# Patient Record
Sex: Male | Born: 1955 | Race: White | Hispanic: No | Smoking: Current every day smoker
Health system: Southern US, Community
[De-identification: ages and names within clinical notes are randomized; demographics above are authoritative.]

## PROBLEM LIST (undated history)

## (undated) DIAGNOSIS — F419 Anxiety disorder, unspecified: Secondary | ICD-10-CM

## (undated) DIAGNOSIS — J449 Chronic obstructive pulmonary disease, unspecified: Secondary | ICD-10-CM

## (undated) HISTORY — PX: APPENDECTOMY: SHX54

---

## 2015-09-07 ENCOUNTER — Encounter (HOSPITAL_COMMUNITY): Payer: Self-pay | Admitting: Emergency Medicine

## 2015-09-07 ENCOUNTER — Emergency Department (HOSPITAL_COMMUNITY): Payer: PRIVATE HEALTH INSURANCE

## 2015-09-07 ENCOUNTER — Emergency Department (HOSPITAL_COMMUNITY)
Admission: EM | Admit: 2015-09-07 | Discharge: 2015-09-07 | Disposition: A | Discharge status: Home / Self Care | Payer: PRIVATE HEALTH INSURANCE | Attending: Emergency Medicine | Admitting: Emergency Medicine

## 2015-09-07 DIAGNOSIS — F1020 Alcohol dependence, uncomplicated: Secondary | ICD-10-CM | POA: Diagnosis not present

## 2015-09-07 DIAGNOSIS — F17210 Nicotine dependence, cigarettes, uncomplicated: Secondary | ICD-10-CM | POA: Diagnosis not present

## 2015-09-07 DIAGNOSIS — Z8659 Personal history of other mental and behavioral disorders: Secondary | ICD-10-CM | POA: Insufficient documentation

## 2015-09-07 DIAGNOSIS — Z880 Allergy status to penicillin: Secondary | ICD-10-CM | POA: Insufficient documentation

## 2015-09-07 DIAGNOSIS — R0602 Shortness of breath: Secondary | ICD-10-CM | POA: Diagnosis present

## 2015-09-07 DIAGNOSIS — Z5900 Homelessness unspecified: Secondary | ICD-10-CM

## 2015-09-07 DIAGNOSIS — J441 Chronic obstructive pulmonary disease with (acute) exacerbation: Secondary | ICD-10-CM | POA: Insufficient documentation

## 2015-09-07 HISTORY — DX: Chronic obstructive pulmonary disease, unspecified: J44.9

## 2015-09-07 HISTORY — DX: Anxiety disorder, unspecified: F41.9

## 2015-09-07 LAB — CBC WITH DIFFERENTIAL/PLATELET
Basophils Absolute: 0 10*3/uL (ref 0.0–0.1)
Basophils Relative: 0 %
Eosinophils Absolute: 0.1 10*3/uL (ref 0.0–0.7)
Eosinophils Relative: 0 %
HEMATOCRIT: 43.7 % (ref 39.0–52.0)
HEMOGLOBIN: 15.6 g/dL (ref 13.0–17.0)
LYMPHS ABS: 1.6 10*3/uL (ref 0.7–4.0)
Lymphocytes Relative: 12 %
MCH: 34.7 pg — AB (ref 26.0–34.0)
MCHC: 35.7 g/dL (ref 30.0–36.0)
MCV: 97.1 fL (ref 78.0–100.0)
MONOS PCT: 10 %
Monocytes Absolute: 1.3 10*3/uL — ABNORMAL HIGH (ref 0.1–1.0)
NEUTROS ABS: 10.5 10*3/uL — AB (ref 1.7–7.7)
NEUTROS PCT: 78 %
Platelets: 245 10*3/uL (ref 150–400)
RBC: 4.5 MIL/uL (ref 4.22–5.81)
RDW: 13.7 % (ref 11.5–15.5)
WBC: 13.4 10*3/uL — ABNORMAL HIGH (ref 4.0–10.5)

## 2015-09-07 LAB — BASIC METABOLIC PANEL
ANION GAP: 10 (ref 5–15)
BUN: 5 mg/dL — ABNORMAL LOW (ref 6–20)
CHLORIDE: 88 mmol/L — AB (ref 101–111)
CO2: 25 mmol/L (ref 22–32)
Calcium: 8.5 mg/dL — ABNORMAL LOW (ref 8.9–10.3)
Creatinine, Ser: 0.69 mg/dL (ref 0.61–1.24)
GFR calc non Af Amer: >60 mL/min (ref >60)
Glucose, Bld: 93 mg/dL (ref 65–99)
POTASSIUM: 4.5 mmol/L (ref 3.5–5.1)
Sodium: 123 mmol/L — ABNORMAL LOW (ref 135–145)

## 2015-09-07 MED ORDER — PREDNISONE 20 MG PO TABS: 20.0000 mg | ORAL_TABLET | Freq: Two times a day (BID) | ORAL | Status: DC

## 2015-09-07 MED ORDER — PREDNISONE 20 MG PO TABS
60.0000 mg | ORAL_TABLET | Freq: Once | ORAL | Status: AC
  Administered 2015-09-07: 60 mg via ORAL
  Filled 2015-09-07: qty 3

## 2015-09-07 MED ORDER — ALBUTEROL SULFATE HFA 108 (90 BASE) MCG/ACT IN AERS
2.0000 | INHALATION_SPRAY | RESPIRATORY_TRACT | Status: DC | PRN
  Administered 2015-09-07: 2 via RESPIRATORY_TRACT
  Filled 2015-09-07: qty 6.7

## 2015-09-07 MED ORDER — CHLORDIAZEPOXIDE HCL 25 MG PO CAPS
25.0000 mg | ORAL_CAPSULE | Freq: Once | ORAL | Status: AC
  Administered 2015-09-07: 25 mg via ORAL
  Filled 2015-09-07: qty 1

## 2015-09-07 MED ORDER — ALBUTEROL (5 MG/ML) CONTINUOUS INHALATION SOLN
10.0000 mg/h | INHALATION_SOLUTION | Freq: Once | RESPIRATORY_TRACT | Status: AC
  Administered 2015-09-07: 10 mg/h via RESPIRATORY_TRACT
  Filled 2015-09-07: qty 20

## 2015-09-07 MED ORDER — CHLORDIAZEPOXIDE HCL 25 MG PO CAPS: ORAL_CAPSULE | ORAL | Status: DC

## 2015-09-07 MED ORDER — AEROCHAMBER Z-STAT PLUS/MEDIUM MISC
1.0000 | Freq: Once | Status: DC
  Filled 2015-09-07: qty 1

## 2015-09-07 NOTE — ED Provider Notes (Signed)
CSN: [id number]     Arrival date & time 09/07/15  4098 History   First MD Initiated Contact with Patient 09/07/15 [phone]     Chief Complaint  Patient presents with  . Shortness of Breath     (Consider location/radiation/quality/duration/timing/severity/associated sxs/prior Treatment) HPI   Mark Sellers is a 60 y.o. male who is homeless, presents for evaluation of cough, productive of green sputum, shortness of breath, and weakness. Onset of symptoms 3 or 4 days ago. He admits smoking. He has felt cold but not taken his temperature. He has some chest discomfort, with coughing. He denies nausea, vomiting, back pain, headache or dizziness. He's not had any recent medical care, or recent medical illnesses. There are no other known modifying factors.   Past Medical History  Diagnosis Date  . COPD (chronic obstructive pulmonary disease) (HCC)   . Anxiety    History reviewed. No pertinent past surgical history. No family history on file. Social History  Substance Use Topics  . Smoking status: Current Every Day Smoker -- 0.50 packs/day    Types: Cigarettes  . Smokeless tobacco: None  . Alcohol Use: Yes    Review of Systems  All other systems reviewed and are negative.     Allergies  Codeine and Penicillins  Home Medications   Prior to Admission medications   Not on File   BP 130/96 mmHg  Pulse 70  Temp(Src) 98.4 F (36.9 C) (Oral)  Resp 16  SpO2 92% Physical Exam  Constitutional: He is oriented to person, place, and time. He appears well-developed.  Appears older than stated age. Frail, malnourished appearance.  HENT:  Head: Normocephalic and atraumatic.  Right Ear: External ear normal.  Left Ear: External ear normal.  Eyes: Conjunctivae and EOM are normal. Pupils are equal, round, and reactive to light.  Neck: Normal range of motion and phonation normal. Neck supple.  Cardiovascular: Normal rate, regular rhythm and normal heart sounds.   Pulmonary/Chest: Effort  normal. No respiratory distress. He has no rales. He exhibits no tenderness and no bony tenderness.  Decreased bilaterally, with scattered rhonchi and wheezes.  Abdominal: Soft. There is no tenderness.  Musculoskeletal: Normal range of motion. He exhibits no edema or tenderness.  Neurological: He is alert and oriented to person, place, and time. No cranial nerve deficit or sensory deficit. He exhibits normal muscle tone. Coordination normal.  Skin: Skin is warm, dry and intact.  Psychiatric: He has a normal mood and affect. His behavior is normal. Judgment and thought content normal.  Nursing note and vitals reviewed.   ED Course  Procedures (including critical care time) Medications  albuterol (PROVENTIL HFA;VENTOLIN HFA) 108 (90 Base) MCG/ACT inhaler 2 puff (2 puffs Inhalation Given 09/07/15 1207)  aerochamber Z-Stat Plus/medium 1 each (not administered)  albuterol (PROVENTIL,VENTOLIN) solution continuous neb (10 mg/hr Nebulization Given 09/07/15 0916)  predniSONE (DELTASONE) tablet 60 mg (60 mg Oral Given 09/07/15 0854)  chlordiazePOXIDE (LIBRIUM) capsule 25 mg (25 mg Oral Given 09/07/15 1208)    Patient Vitals for the past 24 hrs:  BP Temp Temp src Pulse Resp SpO2  09/07/15 1206 130/96 mmHg - - 70 - -  09/07/15 1130 133/79 mmHg 98.4 F (36.9 C) Oral 71 16 92 %  09/07/15 0959 161/87 mmHg - - 79 22 95 %  09/07/15 0918 - - - - - 92 %  09/07/15 0843 134/77 mmHg - - 81 25 92 %  09/07/15 0841 134/77 mmHg 97.8 F (36.6 C) Oral 65 20 93 %  09/07/15 0839 - - - - - 98 %    12:07 PM Reevaluation with update and discussion. After initial assessment and treatment, an updated evaluation reveals he states his breathing is better. Lungs have somewhat improved air movement, without audible wheezing at this time. He now states, "I had a panic attack earlier." He also states, "I'm an alcoholic and have had a drink in a while, and shaking." Hands with mild tremor, he remains lucid and cooperative. He  requests alcohol detoxification. I informed him that his symptoms would be best treated as an outpatient and alcohol treatment center, which is available in the community. Librium and albuterol inhaler ordered. Mark Sellers L   He was evaluated by management in ED, who gave him resources for follow-up care and assistance.    Labs Review Labs Reviewed  BASIC METABOLIC PANEL - Abnormal; Notable for the following:    Sodium 123 (*)    Chloride 88 (*)    BUN 5 (*)    Calcium 8.5 (*)    All other components within normal limits  CBC WITH DIFFERENTIAL/PLATELET - Abnormal; Notable for the following:    WBC 13.4 (*)    MCH 34.7 (*)    Neutro Abs 10.5 (*)    Monocytes Absolute 1.3 (*)    All other components within normal limits    Imaging Review Dg Chest 2 View  09/07/2015  CLINICAL DATA:  Shortness of breath with cough and congestion for 2 days. History of COPD. Chronic tobacco use EXAM: CHEST  2 VIEW COMPARISON:  None. FINDINGS: Lungs are hyperexpanded with prominence of the retrosternal clear space, findings that are consistent with the stated history of COPD. There is slight scarring in the right upper lobe. There is no edema or consolidation. The heart size and pulmonary vascularity are normal. No adenopathy. No bone lesions. IMPRESSION: Evidence of a degree of underlying COPD. Mild scarring right upper lobe. No edema or consolidation. Electronically Signed   By: Bretta Bang III M.D.   On: 09/07/2015 09:19   I have personally reviewed and evaluated these images and lab results as part of my medical decision-making.   EKG Interpretation   Date/Time:  Friday September 07 2015 08:40:08 EST Ventricular Rate:  58 PR Interval:  147 QRS Duration: 92 QT Interval:  451 QTC Calculation: 443 R Axis:   85 Text Interpretation:  Sinus rhythm Anteroseptal infarct, age indeterminate  No old tracing to compare Confirmed by [facility]  MD, Kegan Mckeithan [phone]) on  09/07/2015 8:49:00 AM      MDM    Final diagnoses:  COPD exacerbation (HCC)  Alcoholism (HCC)  Homelessness    Exacerbation, without concerning for pneumonia, sepsis or metabolic instability. Doubt ACS. Alcoholism, with mild tremor, but no evidence for DTs, or concern for worsening neurologic status.  Nursing Notes Reviewed/ Care Coordinated Applicable Imaging Reviewed Interpretation of Laboratory Data incorporated into ED treatment  The patient appears reasonably screened and/or stabilized for discharge and I doubt any other medical condition or other [facility] requiring further screening, evaluation, or treatment in the ED at this time prior to discharge.  Plan: Home Medications- Librium taper; Home Treatments- stop EtOH; return here if the recommended treatment, does not improve the symptoms; Recommended follow up- PCP and Treatment Center of choice asap    Mancel Bale, MD 09/07/15 1420

## 2015-09-07 NOTE — ED Notes (Signed)
Pt complaint of anxiety attack. Hyperventilation noted upon entering pt room; able to use therapeutic discussion to calm pt; pt baseline at present time and then VS assessment. Pt states more frequent attacks with bouts of SOB; pt denies PTA medications for anxiety. [place] aware.

## 2015-09-07 NOTE — ED Notes (Signed)
Per EMS pt complaint of new onset SOB 0200 today; hx of COPD.

## 2015-09-07 NOTE — Progress Notes (Signed)
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 [place]" Pt noted with large bags of clothes in room and military clothes Cm discussed with him that Benjamine Sprague was in [place] not [place]  CM offered [facility] as the uninsured International aid/development worker for [employer] and [place] veteran's administration hospitals and clinics  CM discussed nebulizer use Cm discussed albuterol use for emergencies Pt states his girlfriend who he would not given a name or contact # would also assist him Pt called girlfriend using the ED phone provided by ED RN  All written resources placed in a pt belonging bag for pt CM encourage him to also seek services from [employer] senior resources agency   Also CM discussed and provided written information for uninsured accepting pcps, discussed the importance of pcp vs EDP services for f/u care, www.needymeds.org, www.goodrx.com, discounted pharmacies and other [employer] such as [employer] , [employer], affordable care act, financial assistance, uninsured dental services, [place] med assist, DSS and  health department  Reviewed resources for [employer] uninsured accepting pcps like Jovita Kussmaul, family medicine at [employer], [facility], [facility], local urgent care centers, [facility], [facility] family practice, general medical clinics, family services of the [place], [facility] urgent care plus others, medication resources, [facility] out patient pharmacies and housing Pt voiced understanding and appreciation of resources provided

## 2015-09-07 NOTE — Discharge Instructions (Signed)
Avoid all forms of alcohol. Use the inhaler 2 puffs every 4 hours for trouble breathing. Follow-up at one of the listed alcohol treatment centers below.    Chronic Obstructive Pulmonary Disease Exacerbation Chronic obstructive pulmonary disease (COPD) is a common lung condition in which airflow from the lungs is limited. COPD is a general term that can be used to describe many different lung problems that limit airflow, including chronic bronchitis and emphysema. COPD exacerbations are episodes when breathing symptoms become much worse and require extra treatment. Without treatment, COPD exacerbations can be life threatening, and frequent COPD exacerbations can cause further damage to your lungs. CAUSES  Respiratory infections.  Exposure to smoke.  Exposure to air pollution, chemical fumes, or dust. Sometimes there is no apparent cause or trigger. RISK FACTORS  Smoking cigarettes.  Older age.  Frequent prior COPD exacerbations. SIGNS AND SYMPTOMS  Increased coughing.  Increased thick spit (sputum) production.  Increased wheezing.  Increased shortness of breath.  Rapid breathing.  Chest tightness. DIAGNOSIS Your medical history, a physical exam, and tests will help your health care provider make a diagnosis. Tests may include:  A chest X-ray.  Basic lab tests.  Sputum testing.  An arterial blood gas test. TREATMENT Depending on the severity of your COPD exacerbation, you may need to be admitted to a hospital for treatment. Some of the treatments commonly used to treat COPD exacerbations are:   Antibiotic medicines.  Bronchodilators. These are drugs that expand the air passages. They may be given with an inhaler or nebulizer. Spacer devices may be needed to help improve drug delivery.  Corticosteroid medicines.  Supplemental oxygen therapy.  Airway clearing techniques, such as noninvasive ventilation (NIV) and positive expiratory pressure (PEP). These provide  respiratory support through a mask or other noninvasive device. HOME CARE INSTRUCTIONS  Do not smoke. Quitting smoking is very important to prevent COPD from getting worse and exacerbations from happening as often.  Avoid exposure to all substances that irritate the airway, especially to tobacco smoke.  If you were prescribed an antibiotic medicine, finish it all even if you start to feel better.  Take all medicines as directed by your health care provider.It is important to use correct technique with inhaled medicines.  Drink enough fluids to keep your urine clear or pale yellow (unless you have a medical condition that requires fluid restriction).  Use a cool mist vaporizer. This makes it easier to clear your chest when you cough.  If you have a home nebulizer and oxygen, continue to use them as directed.  Maintain all necessary vaccinations to prevent infections.  Exercise regularly.  Eat a healthy diet.  Keep all follow-up appointments as directed by your health care provider. SEEK IMMEDIATE MEDICAL CARE IF:  You have worsening shortness of breath.  You have trouble talking.  You have severe chest pain.  You have blood in your sputum.  You have a fever.  You have weakness, vomit repeatedly, or faint.  You feel confused.  You continue to get worse. MAKE SURE YOU:  Understand these instructions.  Will watch your condition.  Will get help right away if you are not doing well or get worse.   This information is not intended to replace advice given to you by your health care provider. Make sure you discuss any questions you have with your health care provider.   Document Released: 05/25/2007 Document Revised: 08/18/2014 Document Reviewed: 04/01/2013 Elsevier Interactive Patient Education 2016 [employer].   [facility] in  the [employer]  Intensive Outpatient Programs: [facility]      [address] [place],  [place] [phone] Both a day and evening program       [facility] Outpatient     [address]        [place], [place] [address] [phone]         ADS: [facility] [address] [place] [phone]  [facility] Mental Health ACCESS LINE: [phone] or [phone] [address] [place], [place] [address] [employer]  Mobile Crisis Teams:                                        [facility]         Mobile Crisis Care Unit [phone]             [facility] [address] [place]in[place] [phone]                                         Intervention[facility] [address] [place] [address] [place] [phone]  Self-Help/Support Groups: Mental Health Assoc. of [employer] of support groups [phone] (call for more info)  Narcotics Anonymous (NA) Caring Services [address] [place] - 2 meetings at this location  Residential Treatment Programs:  [facility]      [address]        [place]       [phone]         [facility] [address] [place] [address] [place], [place]  [phone]  [facility]  [address] [place], [place] [address] [phone] Admissions: 8am-3pm M-F  [facility]     [address]        [place], [place] [address]       [phone]         [facility] [address] [place], [place] [phone]  [facility] [address] [place], [place] [phone]  [facility] - Intensive Outpatient      [address]     [place], [place]       [phone]         [facility] ([facility]     [address] [place], [place] [phone] or [phone]  [facility] ([facility])  [address] [place],  [place] [phone]  [facility] [address]                                               [address] [place] [phone]  [facility] Resources: [phone]               General Therapy                                                Angie Fava, PhD        [phone]  [address] [place], [place] [address]         [phone]   Insurance  [facility]   [address] [place], [place] [address] [phone]  [facility] [address] [place], [place] [address] [phone] Insurance/Medicaid/sponsorship through [facility]                                              [address]                                        [place], [place] [address]    Therapy/tele-psych/case         [phone]          [facility] [address] [place], [place]  [address]  Adolescent/group home/case management [phone]                                           Creola Corn PhD       General therapy       Insurance   [phone]         Dr. Lolly Mustache Insurance [phone] M-F  [facility] Medicaid, sponsorship [phone]

## 2015-09-07 NOTE — Progress Notes (Signed)
ED RN consulted ED CM about pt needing uninsured resources

## 2015-09-08 ENCOUNTER — Emergency Department (HOSPITAL_COMMUNITY)
Admission: EM | Admit: 2015-09-08 | Discharge: 2015-09-08 | Disposition: A | Discharge status: Home / Self Care | Payer: PRIVATE HEALTH INSURANCE | Attending: Emergency Medicine | Admitting: Emergency Medicine

## 2015-09-08 ENCOUNTER — Encounter: Payer: Self-pay | Admitting: Emergency Medicine

## 2015-09-08 ENCOUNTER — Emergency Department: Payer: PRIVATE HEALTH INSURANCE

## 2015-09-08 ENCOUNTER — Emergency Department
Admission: EM | Admit: 2015-09-08 | Discharge: 2015-09-08 | Disposition: A | Discharge status: Home / Self Care | Payer: PRIVATE HEALTH INSURANCE | Attending: Emergency Medicine | Admitting: Emergency Medicine

## 2015-09-08 ENCOUNTER — Encounter (HOSPITAL_COMMUNITY): Payer: Self-pay

## 2015-09-08 DIAGNOSIS — R079 Chest pain, unspecified: Secondary | ICD-10-CM | POA: Diagnosis not present

## 2015-09-08 DIAGNOSIS — Z7952 Long term (current) use of systemic steroids: Secondary | ICD-10-CM | POA: Diagnosis not present

## 2015-09-08 DIAGNOSIS — F17210 Nicotine dependence, cigarettes, uncomplicated: Secondary | ICD-10-CM | POA: Insufficient documentation

## 2015-09-08 DIAGNOSIS — Z5900 Homelessness unspecified: Secondary | ICD-10-CM

## 2015-09-08 DIAGNOSIS — J441 Chronic obstructive pulmonary disease with (acute) exacerbation: Secondary | ICD-10-CM | POA: Diagnosis not present

## 2015-09-08 DIAGNOSIS — R200 Anesthesia of skin: Secondary | ICD-10-CM | POA: Insufficient documentation

## 2015-09-08 DIAGNOSIS — Z880 Allergy status to penicillin: Secondary | ICD-10-CM | POA: Diagnosis not present

## 2015-09-08 DIAGNOSIS — Z79899 Other long term (current) drug therapy: Secondary | ICD-10-CM | POA: Diagnosis not present

## 2015-09-08 DIAGNOSIS — F419 Anxiety disorder, unspecified: Secondary | ICD-10-CM | POA: Insufficient documentation

## 2015-09-08 LAB — CBC
HEMATOCRIT: 44.7 % (ref 40.0–52.0)
Hemoglobin: 15.3 g/dL (ref 13.0–18.0)
MCH: 34.3 pg — ABNORMAL HIGH (ref 26.0–34.0)
MCHC: 34.1 g/dL (ref 32.0–36.0)
MCV: 100.5 fL — ABNORMAL HIGH (ref 80.0–100.0)
PLATELETS: 245 10*3/uL (ref 150–440)
RBC: 4.45 MIL/uL (ref 4.40–5.90)
RDW: 14.8 % — AB (ref 11.5–14.5)
WBC: 13.5 10*3/uL — AB (ref 3.8–10.6)

## 2015-09-08 LAB — COMPREHENSIVE METABOLIC PANEL
ALBUMIN: 4.2 g/dL (ref 3.5–5.0)
ALT: 30 U/L (ref 17–63)
AST: 42 U/L — AB (ref 15–41)
Alkaline Phosphatase: 54 U/L (ref 38–126)
Anion gap: 14 (ref 5–15)
BUN: 10 mg/dL (ref 6–20)
CHLORIDE: 92 mmol/L — AB (ref 101–111)
CO2: 24 mmol/L (ref 22–32)
Calcium: 9.3 mg/dL (ref 8.9–10.3)
Creatinine, Ser: 0.77 mg/dL (ref 0.61–1.24)
GFR calc Af Amer: >60 mL/min (ref >60)
GFR calc non Af Amer: >60 mL/min (ref >60)
GLUCOSE: 119 mg/dL — AB (ref 65–99)
POTASSIUM: 4.5 mmol/L (ref 3.5–5.1)
SODIUM: 130 mmol/L — AB (ref 135–145)
Total Bilirubin: 1.2 mg/dL (ref 0.3–1.2)
Total Protein: 8.1 g/dL (ref 6.5–8.1)

## 2015-09-08 LAB — TROPONIN I: Troponin I: <0.03 ng/mL (ref <0.031)

## 2015-09-08 MED ORDER — ALBUTEROL SULFATE (2.5 MG/3ML) 0.083% IN NEBU
5.0000 mg | INHALATION_SOLUTION | Freq: Once | RESPIRATORY_TRACT | Status: AC
  Administered 2015-09-08: 5 mg via RESPIRATORY_TRACT

## 2015-09-08 MED ORDER — ALBUTEROL SULFATE (2.5 MG/3ML) 0.083% IN NEBU
INHALATION_SOLUTION | RESPIRATORY_TRACT | Status: AC
  Administered 2015-09-08: 5 mg via RESPIRATORY_TRACT
  Filled 2015-09-08: qty 3

## 2015-09-08 MED ORDER — IPRATROPIUM-ALBUTEROL 0.5-2.5 (3) MG/3ML IN SOLN
3.0000 mL | Freq: Once | RESPIRATORY_TRACT | Status: AC
  Administered 2015-09-08: 3 mL via RESPIRATORY_TRACT
  Filled 2015-09-08: qty 3

## 2015-09-08 MED ORDER — LORAZEPAM 1 MG PO TABS
1.0000 mg | ORAL_TABLET | Freq: Once | ORAL | Status: AC
  Administered 2015-09-08: 1 mg via ORAL
  Filled 2015-09-08: qty 1

## 2015-09-08 MED ORDER — DEXAMETHASONE SODIUM PHOSPHATE 10 MG/ML IJ SOLN
10.0000 mg | Freq: Once | INTRAMUSCULAR | Status: AC
  Administered 2015-09-08: 10 mg via INTRAMUSCULAR
  Filled 2015-09-08: qty 1

## 2015-09-08 MED ORDER — DOXYCYCLINE HYCLATE 100 MG PO CAPS: 100.0000 mg | ORAL_CAPSULE | Freq: Two times a day (BID) | ORAL | Status: DC

## 2015-09-08 NOTE — ED Provider Notes (Signed)
[facility] Emergency Department Provider Note  ____________________________________________   I have reviewed the triage vital signs and the nursing notes.   HISTORY  Chief Complaint Chest tightness   HPI Mark Sellers is a 60 y.o. male who presents today with primary complaint of chest tightness. He reports this started earlier in the day and has been intermittent. He told the nurse that he is having left arm numbness but he denies this to me. He reports his breathing is at baseline. Patient apparently was seen at [facility] ED last night for similar complaints.     Past Medical History  Diagnosis Date  . COPD (chronic obstructive pulmonary disease) (HCC)   . Anxiety     There are no active problems to display for this patient.   No past surgical history on file.  Current Outpatient Rx  Name  Route  Sig  Dispense  Refill  . chlordiazePOXIDE (LIBRIUM) 25 MG capsule       PO TID x 1D, then 25-50mg  PO BID X 1D, then 25-50mg  PO QD X 1D   10 capsule   0     For symptoms of alcohol withdrawal   . doxycycline (VIBRAMYCIN) 100 MG capsule   Oral   Take 1 capsule (100 mg total) by mouth 2 (two) times daily.   20 capsule   0   . predniSONE (DELTASONE) 20 MG tablet   Oral   Take 1 tablet (20 mg total) by mouth 2 (two) times daily.   10 tablet   0     Allergies Codeine and Penicillins  No family history on file.  Social History Social History  Substance Use Topics  . Smoking status: Current Every Day Smoker -- 0.50 packs/day    Types: Cigarettes  . Smokeless tobacco: None  . Alcohol Use: Yes    Review of Systems  Constitutional: Negative for fever. Eyes: Negative for visual changes. ENT: Negative for sore throat Cardiovascular: Positive for chest tightness Respiratory: Denies shortness of breath Gastrointestinal: Negative for abdominal pain Genitourinary: Negative for dysuria. Musculoskeletal: Negative for back pain. Skin:  Negative for rash. Neurological: Negative for headaches or focal weakness Psychiatric: No anxiety    ____________________________________________   PHYSICAL EXAM:  VITAL SIGNS: ED Triage Vitals  Enc Vitals Group     BP 09/08/15 1839 124/80 mmHg     Pulse Rate 09/08/15 1839 80     Resp 09/08/15 1839 18     Temp 09/08/15 1839 97.7 F (36.5 C)     Temp Source 09/08/15 1839 Oral     SpO2 09/08/15 1839 93 %     Weight 09/08/15 1839 145 lb (65.772 kg)     Height 09/08/15 1839 6' (1.829 m)     Head Cir --      Peak Flow --      Pain Score 09/08/15 1840 9     Pain Loc --      Pain Edu? --      Excl. in GC? --      Constitutional: Alert and oriented. Well appearing and in no distress. Eyes: Conjunctivae are normal.  ENT   Head: Normocephalic and atraumatic.   Mouth/Throat: Mucous membranes are moist. Cardiovascular: Normal rate, regular rhythm. Normal and symmetric distal pulses are present in all extremities. No murmurs, rubs, or gallops. Respiratory: Normal respiratory effort without tachypnea nor retractions. Scattered wheezes Gastrointestinal: Soft and non-tender in all quadrants. No distention. There is no CVA tenderness. Genitourinary: deferred Musculoskeletal: Nontender with  normal range of motion in all extremities. No lower extremity tenderness nor edema. Neurologic:  Normal speech and language. No gross focal neurologic deficits are appreciated. Skin:  Skin is warm, dry and intact. No rash noted. Psychiatric: Mood and affect are normal. Patient exhibits appropriate insight and judgment.  ____________________________________________    LABS (pertinent positives/negatives)  Labs Reviewed  CBC - Abnormal; Notable for the following:    WBC 13.5 (*)    MCV 100.5 (*)    MCH 34.3 (*)    RDW 14.8 (*)    All other components within normal limits  COMPREHENSIVE METABOLIC PANEL - Abnormal; Notable for the following:    Sodium 130 (*)    Chloride 92 (*)     Glucose, Bld 119 (*)    AST 42 (*)    All other components within normal limits  TROPONIN I    ____________________________________________   EKG  ED ECG REPORT I, Jene Every, the attending physician, personally viewed and interpreted this ECG.  Date: 09/08/2015 EKG Time: 6:43 PM Rate: 80 Rhythm: normal sinus rhythm QRS Axis: normal Intervals: normal ST/T Wave abnormalities: normal Conduction Disturbances: none Narrative Interpretation: unremarkable   ____________________________________________    RADIOLOGY I have personally reviewed any xrays that were ordered on this patient: Chest x-ray unremarkable  ____________________________________________   PROCEDURES  Procedure(s) performed: none  Critical Care performed:none  ____________________________________________   INITIAL IMPRESSION / ASSESSMENT AND PLAN / ED COURSE  Pertinent labs & imaging results that were available during my care of the patient were reviewed by me and considered in my medical decision making (see chart for details).  Patient well-appearing and in no distress. His EKG is unremarkable and reassuring. His lab work is essentially unchanged from yesterday. He does have some scattered wheezes which is likely the cause of this chest tightness. We will give a DuoNeb in the emergency department. He denies numbness to me although he noted this triage nurse. He is neurologically intact and has good strength in all extremities.  ____________________________________________   FINAL CLINICAL IMPRESSION(S) / ED DIAGNOSES  Final diagnoses:  Chest pain, unspecified chest pain type     Jene Every, MD 09/08/15 2227

## 2015-09-08 NOTE — Discharge Instructions (Signed)

## 2015-09-08 NOTE — ED Provider Notes (Signed)
CSN: [id number]     Arrival date & time 09/08/15  0225 History   First MD Initiated Contact with Patient 09/08/15 [phone]     Chief Complaint  Patient presents with  . Shortness of Breath   The history is provided by the patient. No language interpreter was used.   Mark Sellers is a 60 y.o. male with history of COPD and anxiety who presents to the Emergency Department complaining of worsening SOB with onset 3-4 days ago. Associated symptoms include chills, productive cough, and anxiety. Pt denies fever. He was seen in the ED less than 24 hours ago but notes that his symptoms are worse since then and his inhaler has provided insufficient relief. He is homeless and not on home oxygen. Prior to this exacerbation, it had been years since he was last seen in the hospital for COPD.  He last used alcohol 1 week ago but was a daily drinker prior to that. He states he has had tremors with previous times of abstinence.   Past Medical History  Diagnosis Date  . COPD (chronic obstructive pulmonary disease) (HCC)   . Anxiety    History reviewed. No pertinent past surgical history. History reviewed. No pertinent family history. Social History  Substance Use Topics  . Smoking status: Current Every Day Smoker -- 0.50 packs/day    Types: Cigarettes  . Smokeless tobacco: None  . Alcohol Use: Yes    Review of Systems  Constitutional: Positive for chills. Negative for fever.  Respiratory: Positive for cough and shortness of breath.   Cardiovascular: Negative for chest pain.  Psychiatric/Behavioral: The patient is nervous/anxious.   All other systems reviewed and are negative.  Allergies  Codeine and Penicillins  Home Medications   Prior to Admission medications   Medication Sig Start Date End Date Taking? Authorizing Provider  chlordiazePOXIDE (LIBRIUM) 25 MG capsule  PO TID x 1D, then 25-50mg  PO BID X 1D, then 25-50mg  PO QD X 1D 09/07/15  Yes Mancel Bale, MD  predniSONE (DELTASONE) 20 MG  tablet Take 1 tablet (20 mg total) by mouth 2 (two) times daily. 09/07/15  Yes Mancel Bale, MD  doxycycline (VIBRAMYCIN) 100 MG capsule Take 1 capsule (100 mg total) by mouth 2 (two) times daily. 09/08/15   Shon Baton, MD   BP 156/91 mmHg  Pulse 76  Temp(Src) 99.6 F (37.6 C) (Oral)  Resp 22  SpO2 93% Physical Exam  Constitutional: He is oriented to person, place, and time.  Disheveled, unkempt, no acute distress  HENT:  Head: Normocephalic and atraumatic.  Eyes: Pupils are equal, round, and reactive to light.  Cardiovascular: Normal rate, regular rhythm and normal heart sounds.   No murmur heard. Pulmonary/Chest: Effort normal and breath sounds normal. No respiratory distress. He has no wheezes.  Fair movement, scant expiratory wheeze  Abdominal: Soft. Bowel sounds are normal. There is no tenderness. There is no rebound.  Musculoskeletal: He exhibits no edema.  Neurological: He is alert and oriented to person, place, and time.  Skin: Skin is warm and dry.  Psychiatric: He has a normal mood and affect.  Nursing note and vitals reviewed.   ED Course  Procedures (including critical care time) DIAGNOSTIC STUDIES: Oxygen Saturation is 93% on RA, adequate by my interpretation.    COORDINATION OF CARE: 3:36 AM Discussed treatment plan which includes a breathing treatment with pt at bedside and pt agreed to plan.  Labs Review Labs Reviewed - No data to display  Imaging Review Dg Chest  2 View  09/07/2015  CLINICAL DATA:  Shortness of breath with cough and congestion for 2 days. History of COPD. Chronic tobacco use EXAM: CHEST  2 VIEW COMPARISON:  None. FINDINGS: Lungs are hyperexpanded with prominence of the retrosternal clear space, findings that are consistent with the stated history of COPD. There is slight scarring in the right upper lobe. There is no edema or consolidation. The heart size and pulmonary vascularity are normal. No adenopathy. No bone lesions. IMPRESSION:  Evidence of a degree of underlying COPD. Mild scarring right upper lobe. No edema or consolidation. Electronically Signed   By: Bretta Bang III M.D.   On: 09/07/2015 09:19     EKG Interpretation None      MDM   Final diagnoses:  COPD exacerbation (HCC)  Homelessness    Patient presents with persistent shortness of breath and cough. Nontoxic on exam.  Afebrile. O2 sats on room air 93-96%.  No acute distress. Minimal wheezing on initial exam. I have reviewed workup from earlier today. No evidence of pneumonia. Patient was given IM Decadron. DuoNeb 2. On repeat exam, patient with clear breath sounds bilaterally. He was able to ambulate and maintain pulse ox greater than 92%. Patient is currently homeless. He was evaluated by case management yesterday and given resources for shelters. He tells me that "if I can get to [facility] got someone I can stay with."  Patient was encouraged to continue steroids and his inhaler. He will also be discharged with doxycycline given history of COPD.  After history, exam, and medical workup I feel the patient has been appropriately medically screened and is safe for discharge home. Pertinent diagnoses were discussed with the patient. Patient was given return precautions.  I personally performed the services described in this documentation, which was scribed in my presence. The recorded information has been reviewed and is accurate.     Shon Baton, MD 09/08/15 [phone]

## 2015-09-08 NOTE — ED Notes (Signed)
Pt. Waiting for ride in waiting room. 

## 2015-09-08 NOTE — ED Notes (Signed)
Pt reports SOB and left arm numbness that started 3 hours ago while sitting on the side of the road. Pt also reports chest pain.

## 2015-09-08 NOTE — ED Notes (Addendum)
Pt walked to RN desk hyperventalating stating he was having difficulty breathing.

## 2015-09-08 NOTE — ED Notes (Addendum)
Pt states he had a COPD exacerbation when walking outside moments ago. Pt states he has x4 episodes of hyperventalating while waiting in the [facility] ED Lobby. Pt seen here at 1406 on 1/27 for COPD Exacerbation.

## 2015-09-08 NOTE — Discharge Instructions (Signed)
Chronic Obstructive Pulmonary Disease Chronic obstructive pulmonary disease (COPD) is a common lung condition in which airflow from the lungs is limited. COPD is a general term that can be used to describe many different lung problems that limit airflow, including both chronic bronchitis and emphysema. If you have COPD, your lung function will probably never return to normal, but there are measures you can take to improve lung function and make yourself feel better. CAUSES   Smoking (common).  Exposure to secondhand smoke.  Genetic problems.  Chronic inflammatory lung diseases or recurrent infections. SYMPTOMS  Shortness of breath, especially with physical activity.  Deep, persistent (chronic) cough with a large amount of thick mucus.  Wheezing.  Rapid breaths (tachypnea).  Gray or bluish discoloration (cyanosis) of the skin, especially in your fingers, toes, or lips.  Fatigue.  Weight loss.  Frequent infections or episodes when breathing symptoms become much worse (exacerbations).  Chest tightness. DIAGNOSIS Your health care provider will take a medical history and perform a physical examination to diagnose COPD. Additional tests for COPD may include:  Lung (pulmonary) function tests.  Chest X-ray.  CT scan.  Blood tests. TREATMENT  Treatment for COPD may include:  Inhaler and nebulizer medicines. These help manage the symptoms of COPD and make your breathing more comfortable.  Supplemental oxygen. Supplemental oxygen is only helpful if you have a low oxygen level in your blood.  Exercise and physical activity. These are beneficial for nearly all people with COPD.  Lung surgery or transplant.  Nutrition therapy to gain weight, if you are underweight.  Pulmonary rehabilitation. This may involve working with a team of health care providers and specialists, such as respiratory, occupational, and physical therapists. HOME CARE INSTRUCTIONS  Take all medicines  (inhaled or pills) as directed by your health care provider.  Avoid over-the-counter medicines or cough syrups that dry up your airway (such as antihistamines) and slow down the elimination of secretions unless instructed otherwise by your health care provider.  If you are a smoker, the most important thing that you can do is stop smoking. Continuing to smoke will cause further lung damage and breathing trouble. Ask your health care provider for help with quitting smoking. He or she can direct you to community resources or hospitals that provide support.  Avoid exposure to irritants such as smoke, chemicals, and fumes that aggravate your breathing.  Use oxygen therapy and pulmonary rehabilitation if directed by your health care provider. If you require home oxygen therapy, ask your health care provider whether you should purchase a pulse oximeter to measure your oxygen level at home.  Avoid contact with individuals who have a contagious illness.  Avoid extreme temperature and humidity changes.  Eat healthy foods. Eating smaller, more frequent meals and resting before meals may help you maintain your strength.  Stay active, but balance activity with periods of rest. Exercise and physical activity will help you maintain your ability to do things you want to do.  Preventing infection and hospitalization is very important when you have COPD. Make sure to receive all the vaccines your health care provider recommends, especially the pneumococcal and influenza vaccines. Ask your health care provider whether you need a pneumonia vaccine.  Learn and use relaxation techniques to manage stress.  Learn and use controlled breathing techniques as directed by your health care provider. Controlled breathing techniques include:  Pursed lip breathing. Start by breathing in (inhaling) through your nose for 1 second. Then, purse your lips as if you were  going to whistle and breathe out (exhale) through the  pursed lips for 2 seconds.  Diaphragmatic breathing. Start by putting one hand on your abdomen just above your waist. Inhale slowly through your nose. The hand on your abdomen should move out. Then purse your lips and exhale slowly. You should be able to feel the hand on your abdomen moving in as you exhale.  Learn and use controlled coughing to clear mucus from your lungs. Controlled coughing is a series of short, progressive coughs. The steps of controlled coughing are: 1. Lean your head slightly forward. 2. Breathe in deeply using diaphragmatic breathing. 3. Try to hold your breath for 3 seconds. 4. Keep your mouth slightly open while coughing twice. 5. Spit any mucus out into a tissue. 6. Rest and repeat the steps once or twice as needed. SEEK MEDICAL CARE IF:  You are coughing up more mucus than usual.  There is a change in the color or thickness of your mucus.  Your breathing is more labored than usual.  Your breathing is faster than usual. SEEK IMMEDIATE MEDICAL CARE IF:  You have shortness of breath while you are resting.  You have shortness of breath that prevents you from:  Being able to talk.  Performing your usual physical activities.  You have chest pain lasting longer than 5 minutes.  Your skin color is more cyanotic than usual.  You measure low oxygen saturations for longer than 5 minutes with a pulse oximeter. MAKE SURE YOU:  Understand these instructions.  Will watch your condition.  Will get help right away if you are not doing well or get worse.   This information is not intended to replace advice given to you by your health care provider. Make sure you discuss any questions you have with your health care provider.   Document Released: 05/07/2005 Document Revised: 08/18/2014 Document Reviewed: 03/24/2013 Elsevier Interactive Patient Education [employer]2016 Elsevier Inc.   Emergency Department Resource Guide 1) Find a Doctor and Pay Out of Pocket Although  you won't have to find out who is covered by your insurance plan, it is a good idea to ask around and get recommendations. You will then need to call the office and see if the doctor you have chosen will accept you as a new patient and what types of options they offer for patients who are self-pay. Some doctors offer discounts or will set up payment plans for their patients who do not have insurance, but you will need to ask so you aren't surprised when you get to your appointment.  2) Contact Your Local Health Department Not all health departments have doctors that can see patients for sick visits, but many do, so it is worth a call to see if yours does. If you don't know where your local health department is, you can check in your phone book. The CDC also has a tool to help you locate your state's health department, and many state websites also have listings of all of their local health departments.  3) Find a Walk-in Clinic If your illness is not likely to be very severe or complicated, you may want to try a walk in clinic. These are popping up all over the country in pharmacies, drugstores, and shopping centers. They're usually staffed by nurse practitioners or physician assistants that have been trained to treat common illnesses and complaints. They're usually fairly quick and inexpensive. However, if you have serious medical issues or chronic medical problems, these are probably not  your best option.  No Primary Care Doctor: - Call Health Connect at  [phone] - they can help you locate a primary care doctor that  accepts your insurance, provides certain services, etc. - Physician Referral Service- [phone]  Chronic Pain Problems: Organization         Address  Phone   Notes  [facility]  [phone] Patients need to be referred by their primary care doctor.   Medication Assistance: Organization         Address  Phone   Notes  [facility] Medication [facility] [address] [place] [place], [place] [address] [phone] --Must be a resident of [facility] -- Must have NO insurance coverage whatsoever (no Medicaid/ Medicare, etc.) -- The pt. MUST have a primary care doctor that directs their care regularly and follows them in the community   [facility]  [phone]   [employer]  [phone]    Agencies that provide inexpensive medical care: Organization         Address  Phone   Notes  [facility]  [phone]   [facility]    [phone]   [facility] [address] [place], [place] [address] [phone]   [facility] [address] [place] [phone]   Planned Parenthood    [phone]   [facility]    [phone]   [facility]  [address], [place] Phone:  [phone], Fax:  [phone] Hours of Operation:  9 am - 6 pm, M-F.  Also accepts Medicaid/Medicare and self-pay.  [facility]  [address], [place] Phone: [phone], Fax: [phone]. Hours of Operation:  8:30 am - 5:30 pm, M-F.  Also accepts Medicaid and self-pay.  [facility] [place] [address] [place] Phone: [phone]   [facility] [address] Natasha Bence [place], [place] [phone], Ext. [phone] Mondays & Thursdays: 7-9 AM.  First 15 patients are seen on a first come, first serve basis.    Medicaid-accepting [facility] Providers:  Organization         Address  Phone   Notes  [facility] [address], [place] [phone] Also accepts self-pay patients.  [facility] [address] Laurell Josephs [place], [place]  [phone]   [facility] [address], [place] [phone]   [facility] [address] [place] [phone]   Renaye Rakers [address], [place]   [phone] Only accepts [place] Access [place] patients after they have their name applied to their card.   Self-Pay (no insurance) in [facility]:  Organization         Address  Phone   Notes  Sickle Cell Patients, [facility] [address] [place], [place] [phone]   [facility] [address] [place], [place] [phone]   [facility] [place]  [address], [place] [phone]   [facility]/Dr. Osei-Bonsu  [address], [place] or [address], [place] [phone] Phone number for both [place] and [place] locations is the same.  [facility] [address]  Dr, Ginette Otto [phone]   [facility] [address] [place] or [address] [phone]   [facility] [address], [place] [phone], phone; [phone], fax Sees patients 1st and 3rd Saturday of every month.  Must not qualify for public or private insurance (i.e. Medicaid, Medicare, [place] Health Choice, Veterans' Benefits)  Household income should be no more than 200% of the poverty level The clinic cannot treat you if you are pregnant or think you are pregnant  Sexually transmitted diseases are not treated at the clinic.    Dental Care: Organization         Address  Phone  Notes  [facility] [address] [place], [place] [phone] Accepts children up to age 55 who are enrolled in [place] or [place] Health Choice; pregnant women with a Medicaid card; and children who have applied for Medicaid or [place] Health Choice, but were declined, whose parents can pay a reduced fee at time of service.  [facility] of [facility]  [address], [place] [phone] Accepts  children up to age 41 who are enrolled in [place] or [place] Health Choice; pregnant women with a Medicaid card; and children who have applied for Medicaid or [place] Health Choice, but were declined, whose parents can pay a reduced fee at time of service.  [facility]  [address] [place], [place] [phone] Patients are seen by appointment only. Walk-ins are not accepted. [facility] will see patients 41 years of age and older. Monday - Tuesday (8am-5pm) Most Wednesdays (8:30-5pm) $30 per visit, cash only  [facility] Adult Dental Access PROGRAM  [address], [facility] [phone] Patients are seen by appointment only. Walk-ins are not accepted. [facility] will see patients 99 years of age and older. One Wednesday Evening (Monthly: Volunteer Based).  $30 per visit, cash only  [employer]  [phone] for adults; Children under age 6, call Graduate Pediatric Dentistry at [phone]. Children aged 69-14, please call [phone] to request a pediatric application.  Dental services are provided in all areas of dental care including fillings, crowns and bridges, complete and partial dentures, implants, gum treatment, root canals, and extractions. Preventive care is also provided. Treatment is provided to both adults and children. Patients are selected via a lottery and there is often a waiting list.   [facility] [address] [place]  [phone] [email or web address]   [facility] [address] [place], [place] [phone], Ext. [phone] Second and Fourth Thursday of each month, opens at 6:30 AM; Clinic ends at 9 AM.  Patients are seen on a first-come first-served basis, and a limited number are seen during each clinic.   [facility]  [address] Ether Griffins [place], [place] [phone]   Eligibility Requirements You must have lived in [place], [place], or [place] counties for at least the  last three months.   You cannot be eligible for state or federal sponsored [place], including [employer], [place], or [employer].   You generally cannot be eligible for healthcare insurance through your employer.    How to apply: Eligibility screenings are held every Tuesday and Wednesday afternoon from 1:00 pm until 4:00 pm. You do not need an appointment for the interview!  [facility] [address]  Sherian Maroon [place], [place] [phone]   [facility]  [phone]   [facility]  [phone]   [facility] Department  [phone]    Behavioral Health Resources in the Community: Intensive Outpatient Programs Organization         Address  Phone  Notes  [facility] [address] [place], [place] [phone]   [facility] Outpatient [address], [place], [place] [phone]   [facility] [address], [place], [place]  [phone]   [facility] Mental Health [address],  [place], [place] [phone] or [phone]   Substance Abuse Resources Organization         Address  Phone  Notes  [facility]  [phone]   [facility]  [phone]   [facility] [place]  [phone]   Floydene Flock  [phone]   Residential & Outpatient Substance Abuse Program  [phone]   Psychological Services Organization         Address  Phone  Notes  [facility] Health  [phone]   [facility]  [phone]   [facility] Mental Health [address] [place] [phone] or [phone]    Mobile Crisis Teams Organization         Address  Phone  Notes  [facility], Mobile Crisis Care Unit  [phone]   [facility]  [address] [place], [place] [phone]   Doristine Locks [address] [place] [phone]     Self-Help/Support Groups Organization         Address  Phone             Notes  [facility] [place] - variety of support groups  336- I7437963 Call for more information  Narcotics Anonymous (NA), [facility] [address], [employer] [place]  2 meetings at this location   Statistician         Address  Phone  Notes  [facility] [address],    [place]  [phone]   [facility]  [address] [place] [address], [place], [place] [phone]   [facility] [address] [place], [place] [phone] Admissions: 8am-3pm M-F  [facility] [address],    [place], [place] [phone]   [facility] [address] [place], [place], [place] [phone]   [facility] [address]  [place], [place] [phone]   [facility] - Intensive Outpatient [address] Laurell Josephs [address], [place], [place] [phone]   [facility] ([facility] [address] [place]  [place], [place] [phone] or [phone]   [facility] ([facility]) [address] [place], [place] [phone] Accepts Medicaid  [facility] [place] [address]  [place] [phone] Substance Abuse/Addiction Treatment   [facility] Organization         Address  Phone  Notes  [facility]  3655547882   Angie Fava, PhD [address] Ervin Knack [place], [place]   [phone] or [phone]   [facility] Behavioral   [address] [place], [place] [phone]   [facility] [address] [place], [place] [phone] Insurance/Medicaid/sponsorship through [employer] and Families [address]  [place], [place] [phone] [place], [place] [phone]    Dr. Adele Schilder  [phone]   [facility]  [facility] [place] Dept. 1) [address] [place] 2) [place] 3)  [place] [address], [place] [phone]  [phone]   [place] [phone] or [phone] (After Hours)

## 2015-09-11 ENCOUNTER — Emergency Department: Payer: PRIVATE HEALTH INSURANCE

## 2015-09-11 ENCOUNTER — Encounter: Payer: Self-pay | Admitting: Emergency Medicine

## 2015-09-11 ENCOUNTER — Emergency Department
Admission: EM | Admit: 2015-09-11 | Discharge: 2015-09-11 | Disposition: A | Discharge status: Home / Self Care | Payer: PRIVATE HEALTH INSURANCE | Attending: Emergency Medicine | Admitting: Emergency Medicine

## 2015-09-11 DIAGNOSIS — Z880 Allergy status to penicillin: Secondary | ICD-10-CM | POA: Insufficient documentation

## 2015-09-11 DIAGNOSIS — J441 Chronic obstructive pulmonary disease with (acute) exacerbation: Secondary | ICD-10-CM | POA: Insufficient documentation

## 2015-09-11 DIAGNOSIS — Z590 Homelessness: Secondary | ICD-10-CM | POA: Insufficient documentation

## 2015-09-11 DIAGNOSIS — R05 Cough: Secondary | ICD-10-CM | POA: Diagnosis present

## 2015-09-11 DIAGNOSIS — F17210 Nicotine dependence, cigarettes, uncomplicated: Secondary | ICD-10-CM | POA: Insufficient documentation

## 2015-09-11 MED ORDER — ALBUTEROL SULFATE HFA 108 (90 BASE) MCG/ACT IN AERS: 2.0000 | INHALATION_SPRAY | Freq: Four times a day (QID) | RESPIRATORY_TRACT | Status: DC | PRN

## 2015-09-11 MED ORDER — PREDNISONE 20 MG PO TABS
60.0000 mg | ORAL_TABLET | Freq: Once | ORAL | Status: AC
  Administered 2015-09-11: 60 mg via ORAL
  Filled 2015-09-11: qty 3

## 2015-09-11 MED ORDER — PREDNISONE 10 MG (21) PO TBPK: 10.0000 mg | ORAL_TABLET | Freq: Every day | ORAL | Status: DC

## 2015-09-11 MED ORDER — IPRATROPIUM-ALBUTEROL 0.5-2.5 (3) MG/3ML IN SOLN
3.0000 mL | Freq: Once | RESPIRATORY_TRACT | Status: AC
  Administered 2015-09-11: 3 mL via RESPIRATORY_TRACT
  Filled 2015-09-11: qty 3

## 2015-09-11 NOTE — ED Notes (Signed)
Pt transported to xray 

## 2015-09-11 NOTE — ED Provider Notes (Signed)
[facility] Emergency Department Provider Note     Time seen: ----------------------------------------- 9:53 AM on 09/11/2015 -----------------------------------------    I have reviewed the triage vital signs and the nursing notes.   HISTORY  Chief Complaint Cough and Nasal Congestion    HPI Mark Sellers is a 60 y.o. male who presents ER forcough congestion and fever for several days. Patient states he coughs so much that he can't stop. He is coughing up productive sputum, was recently seen for same. Patient states she just moved here from [place] and is homeless.   Past Medical History  Diagnosis Date  . COPD (chronic obstructive pulmonary disease) (HCC)   . Anxiety     There are no active problems to display for this patient.   History reviewed. No pertinent past surgical history.  Allergies Codeine and Penicillins  Social History Social History  Substance Use Topics  . Smoking status: Current Every Day Smoker -- 0.50 packs/day    Types: Cigarettes  . Smokeless tobacco: None  . Alcohol Use: Yes    Review of Systems Constitutional: Positive for fever Eyes: Negative for visual changes. ENT: Negative for sore throat. Cardiovascular: Negative for chest pain. Respiratory: Positive for cough and shortness of breath Gastrointestinal: Negative for abdominal pain, vomiting and diarrhea. Genitourinary: Negative for dysuria. Musculoskeletal: Negative for back pain. Skin: Negative for rash. Neurological: Negative for headaches, focal weakness or numbness.  10-point ROS otherwise negative.  ____________________________________________   PHYSICAL EXAM:  VITAL SIGNS: ED Triage Vitals  Enc Vitals Group     BP 09/11/15 0843 132/79 mmHg     Pulse Rate 09/11/15 0843 87     Resp 09/11/15 0843 22     Temp 09/11/15 0843 97.8 F (36.6 C)     Temp Source 09/11/15 0843 Oral     SpO2 09/11/15 0843 95 %     Weight 09/11/15 0843 140 lb  (63.504 kg)     Height 09/11/15 0843 6' (1.829 m)     Head Cir --      Peak Flow --      Pain Score 09/11/15 0839 8     Pain Loc --      Pain Edu? --      Excl. in GC? --     Constitutional: Alert and oriented. Well appearing and in no distress. Eyes: Conjunctivae are normal. PERRL. Normal extraocular movements. ENT   Head: Normocephalic and atraumatic.   Nose: No congestion/rhinnorhea.   Mouth/Throat: Mucous membranes are moist.   Neck: No stridor. Cardiovascular: Normal rate, regular rhythm. Normal and symmetric distal pulses are present in all extremities. No murmurs, rubs, or gallops. Respiratory: Normal respiratory effort without tachypnea nor retractions. Breath sounds are clear and equal bilaterally. No wheezes/rales/rhonchi. Gastrointestinal: Soft and nontender. No distention. No abdominal bruits.  Musculoskeletal: Nontender with normal range of motion in all extremities. No joint effusions.  No lower extremity tenderness nor edema. Neurologic:  Normal speech and language. No gross focal neurologic deficits are appreciated. Speech is normal. No gait instability. Skin:  Skin is warm, dry and intact. No rash noted. Psychiatric: Mood and affect are normal. Speech and behavior are normal. Patient exhibits appropriate insight and judgment. ____________________________________________  ED COURSE:  Pertinent labs & imaging results that were available during my care of the patient were reviewed by me and considered in my medical decision making (see chart for details). Patient is in no acute distress, essentially normal exam. I'll recheck chest x-ray and give him a  breathing treatment. ____________________________________________    RADIOLOGY Images were viewed by me  Chest x-ray reveals COPD without any other active disease  ____________________________________________  FINAL ASSESSMENT AND PLAN  COPD, homelessness  Plan: Patient with imaging as dictated  above. Patient's exam is benign, he will likely has a viral illness in addition to COPD. I will see if I can get him to the homeless shelter today. I think the cold weather and not having a place to stay has resulted in him frequently visiting the ER this week.   Emily Filbert, MD   Emily Filbert, MD 09/11/15 [phone]

## 2015-09-11 NOTE — Discharge Instructions (Signed)
Chronic Obstructive Pulmonary Disease Chronic obstructive pulmonary disease (COPD) is a common lung condition in which airflow from the lungs is limited. COPD is a general term that can be used to describe many different lung problems that limit airflow, including both chronic bronchitis and emphysema. If you have COPD, your lung function will probably never return to normal, but there are measures you can take to improve lung function and make yourself feel better. CAUSES   Smoking (common).  Exposure to secondhand smoke.  Genetic problems.  Chronic inflammatory lung diseases or recurrent infections. SYMPTOMS  Shortness of breath, especially with physical activity.  Deep, persistent (chronic) cough with a large amount of thick mucus.  Wheezing.  Rapid breaths (tachypnea).  Gray or bluish discoloration (cyanosis) of the skin, especially in your fingers, toes, or lips.  Fatigue.  Weight loss.  Frequent infections or episodes when breathing symptoms become much worse (exacerbations).  Chest tightness. DIAGNOSIS Your health care provider will take a medical history and perform a physical examination to diagnose COPD. Additional tests for COPD may include:  Lung (pulmonary) function tests.  Chest X-ray.  CT scan.  Blood tests. TREATMENT  Treatment for COPD may include:  Inhaler and nebulizer medicines. These help manage the symptoms of COPD and make your breathing more comfortable.  Supplemental oxygen. Supplemental oxygen is only helpful if you have a low oxygen level in your blood.  Exercise and physical activity. These are beneficial for nearly all people with COPD.  Lung surgery or transplant.  Nutrition therapy to gain weight, if you are underweight.  Pulmonary rehabilitation. This may involve working with a team of health care providers and specialists, such as respiratory, occupational, and physical therapists. HOME CARE INSTRUCTIONS  Take all medicines  (inhaled or pills) as directed by your health care provider.  Avoid over-the-counter medicines or cough syrups that dry up your airway (such as antihistamines) and slow down the elimination of secretions unless instructed otherwise by your health care provider.  If you are a smoker, the most important thing that you can do is stop smoking. Continuing to smoke will cause further lung damage and breathing trouble. Ask your health care provider for help with quitting smoking. He or she can direct you to community resources or hospitals that provide support.  Avoid exposure to irritants such as smoke, chemicals, and fumes that aggravate your breathing.  Use oxygen therapy and pulmonary rehabilitation if directed by your health care provider. If you require home oxygen therapy, ask your health care provider whether you should purchase a pulse oximeter to measure your oxygen level at home.  Avoid contact with individuals who have a contagious illness.  Avoid extreme temperature and humidity changes.  Eat healthy foods. Eating smaller, more frequent meals and resting before meals may help you maintain your strength.  Stay active, but balance activity with periods of rest. Exercise and physical activity will help you maintain your ability to do things you want to do.  Preventing infection and hospitalization is very important when you have COPD. Make sure to receive all the vaccines your health care provider recommends, especially the pneumococcal and influenza vaccines. Ask your health care provider whether you need a pneumonia vaccine.  Learn and use relaxation techniques to manage stress.  Learn and use controlled breathing techniques as directed by your health care provider. Controlled breathing techniques include:  Pursed lip breathing. Start by breathing in (inhaling) through your nose for 1 second. Then, purse your lips as if you were   going to whistle and breathe out (exhale) through the  pursed lips for 2 seconds.  Diaphragmatic breathing. Start by putting one hand on your abdomen just above your waist. Inhale slowly through your nose. The hand on your abdomen should move out. Then purse your lips and exhale slowly. You should be able to feel the hand on your abdomen moving in as you exhale.  Learn and use controlled coughing to clear mucus from your lungs. Controlled coughing is a series of short, progressive coughs. The steps of controlled coughing are: 1. Lean your head slightly forward. 2. Breathe in deeply using diaphragmatic breathing. 3. Try to hold your breath for 3 seconds. 4. Keep your mouth slightly open while coughing twice. 5. Spit any mucus out into a tissue. 6. Rest and repeat the steps once or twice as needed. SEEK MEDICAL CARE IF:  You are coughing up more mucus than usual.  There is a change in the color or thickness of your mucus.  Your breathing is more labored than usual.  Your breathing is faster than usual. SEEK IMMEDIATE MEDICAL CARE IF:  You have shortness of breath while you are resting.  You have shortness of breath that prevents you from:  Being able to talk.  Performing your usual physical activities.  You have chest pain lasting longer than 5 minutes.  Your skin color is more cyanotic than usual.  You measure low oxygen saturations for longer than 5 minutes with a pulse oximeter. MAKE SURE YOU:  Understand these instructions.  Will watch your condition.  Will get help right away if you are not doing well or get worse.   This information is not intended to replace advice given to you by your health care provider. Make sure you discuss any questions you have with your health care provider.   Document Released: 05/07/2005 Document Revised: 08/18/2014 Document Reviewed: 03/24/2013 Elsevier Interactive Patient Education 2016 [employer]  

## 2015-09-11 NOTE — ED Notes (Signed)
Pt to ed with c/o cough, congestion and fever x several days.  Pt reports all over body aches.  Coughing up white and brown sputum.

## 2015-09-12 ENCOUNTER — Emergency Department
Admission: EM | Admit: 2015-09-12 | Discharge: 2015-09-12 | Disposition: A | Discharge status: Other Acute Inpatient Hospital | Payer: PRIVATE HEALTH INSURANCE | Attending: Emergency Medicine | Admitting: Emergency Medicine

## 2015-09-12 ENCOUNTER — Inpatient Hospital Stay
Admission: EM | Admit: 2015-09-12 | Discharge: 2015-09-13 | DRG: 882 | Disposition: A | Discharge status: Home / Self Care | Payer: PRIVATE HEALTH INSURANCE | Source: Intra-hospital | Attending: Psychiatry | Admitting: Psychiatry

## 2015-09-12 ENCOUNTER — Encounter: Payer: Self-pay | Admitting: Emergency Medicine

## 2015-09-12 DIAGNOSIS — Z590 Homelessness: Secondary | ICD-10-CM | POA: Diagnosis not present

## 2015-09-12 DIAGNOSIS — F1210 Cannabis abuse, uncomplicated: Secondary | ICD-10-CM | POA: Diagnosis not present

## 2015-09-12 DIAGNOSIS — F1020 Alcohol dependence, uncomplicated: Secondary | ICD-10-CM | POA: Diagnosis present

## 2015-09-12 DIAGNOSIS — J449 Chronic obstructive pulmonary disease, unspecified: Secondary | ICD-10-CM

## 2015-09-12 DIAGNOSIS — Z880 Allergy status to penicillin: Secondary | ICD-10-CM | POA: Diagnosis not present

## 2015-09-12 DIAGNOSIS — F1310 Sedative, hypnotic or anxiolytic abuse, uncomplicated: Secondary | ICD-10-CM | POA: Insufficient documentation

## 2015-09-12 DIAGNOSIS — J441 Chronic obstructive pulmonary disease with (acute) exacerbation: Secondary | ICD-10-CM | POA: Diagnosis present

## 2015-09-12 DIAGNOSIS — Z811 Family history of alcohol abuse and dependence: Secondary | ICD-10-CM

## 2015-09-12 DIAGNOSIS — R440 Auditory hallucinations: Secondary | ICD-10-CM | POA: Diagnosis present

## 2015-09-12 DIAGNOSIS — Z886 Allergy status to analgesic agent status: Secondary | ICD-10-CM

## 2015-09-12 DIAGNOSIS — Z79899 Other long term (current) drug therapy: Secondary | ICD-10-CM | POA: Insufficient documentation

## 2015-09-12 DIAGNOSIS — F4323 Adjustment disorder with mixed anxiety and depressed mood: Secondary | ICD-10-CM | POA: Diagnosis present

## 2015-09-12 DIAGNOSIS — F17210 Nicotine dependence, cigarettes, uncomplicated: Secondary | ICD-10-CM | POA: Diagnosis present

## 2015-09-12 DIAGNOSIS — R45851 Suicidal ideations: Secondary | ICD-10-CM | POA: Diagnosis present

## 2015-09-12 DIAGNOSIS — F1220 Cannabis dependence, uncomplicated: Secondary | ICD-10-CM | POA: Diagnosis present

## 2015-09-12 DIAGNOSIS — Z7952 Long term (current) use of systemic steroids: Secondary | ICD-10-CM | POA: Diagnosis not present

## 2015-09-12 DIAGNOSIS — R0602 Shortness of breath: Secondary | ICD-10-CM | POA: Diagnosis present

## 2015-09-12 DIAGNOSIS — F17200 Nicotine dependence, unspecified, uncomplicated: Secondary | ICD-10-CM

## 2015-09-12 LAB — URINE DRUG SCREEN, QUALITATIVE (ARMC ONLY)
Amphetamines, Ur Screen: NOT DETECTED
BENZODIAZEPINE, UR SCRN: POSITIVE — AB
Barbiturates, Ur Screen: NOT DETECTED
CANNABINOID 50 NG, UR ~~LOC~~: POSITIVE — AB
Cocaine Metabolite,Ur ~~LOC~~: NOT DETECTED
MDMA (Ecstasy)Ur Screen: NOT DETECTED
Methadone Scn, Ur: NOT DETECTED
OPIATE, UR SCREEN: NOT DETECTED
PHENCYCLIDINE (PCP) UR S: NOT DETECTED
Tricyclic, Ur Screen: NOT DETECTED

## 2015-09-12 LAB — CBC
HCT: 42.8 % (ref 40.0–52.0)
Hemoglobin: 14.4 g/dL (ref 13.0–18.0)
MCH: 33.7 pg (ref 26.0–34.0)
MCHC: 33.7 g/dL (ref 32.0–36.0)
MCV: 99.9 fL (ref 80.0–100.0)
PLATELETS: 324 10*3/uL (ref 150–440)
RBC: 4.28 MIL/uL — ABNORMAL LOW (ref 4.40–5.90)
RDW: 14.3 % (ref 11.5–14.5)
WBC: 12.8 10*3/uL — ABNORMAL HIGH (ref 3.8–10.6)

## 2015-09-12 LAB — COMPREHENSIVE METABOLIC PANEL
ALK PHOS: 49 U/L (ref 38–126)
ALT: 30 U/L (ref 17–63)
AST: 32 U/L (ref 15–41)
Albumin: 3.5 g/dL (ref 3.5–5.0)
Anion gap: 5 (ref 5–15)
BILIRUBIN TOTAL: 0.7 mg/dL (ref 0.3–1.2)
BUN: 8 mg/dL (ref 6–20)
CALCIUM: 8.9 mg/dL (ref 8.9–10.3)
CO2: 29 mmol/L (ref 22–32)
CREATININE: 0.75 mg/dL (ref 0.61–1.24)
Chloride: 94 mmol/L — ABNORMAL LOW (ref 101–111)
GFR calc Af Amer: >60 mL/min (ref >60)
Glucose, Bld: 96 mg/dL (ref 65–99)
POTASSIUM: 4.6 mmol/L (ref 3.5–5.1)
Sodium: 128 mmol/L — ABNORMAL LOW (ref 135–145)
TOTAL PROTEIN: 7.1 g/dL (ref 6.5–8.1)

## 2015-09-12 LAB — ACETAMINOPHEN LEVEL: Acetaminophen (Tylenol), Serum: <10 ug/mL — ABNORMAL LOW (ref 10–30)

## 2015-09-12 LAB — ETHANOL

## 2015-09-12 LAB — SALICYLATE LEVEL: Salicylate Lvl: <4 mg/dL (ref 2.8–30.0)

## 2015-09-12 MED ORDER — ACETAMINOPHEN 325 MG PO TABS
650.0000 mg | ORAL_TABLET | Freq: Four times a day (QID) | ORAL | Status: DC | PRN
  Administered 2015-09-12: 650 mg via ORAL
  Filled 2015-09-12: qty 2

## 2015-09-12 MED ORDER — IPRATROPIUM-ALBUTEROL 0.5-2.5 (3) MG/3ML IN SOLN
RESPIRATORY_TRACT | Status: AC
  Administered 2015-09-12: 3 mL via RESPIRATORY_TRACT
  Filled 2015-09-12: qty 3

## 2015-09-12 MED ORDER — ALUM & MAG HYDROXIDE-SIMETH 200-200-20 MG/5ML PO SUSP: 30.0000 mL | ORAL | Status: DC | PRN

## 2015-09-12 MED ORDER — IPRATROPIUM-ALBUTEROL 0.5-2.5 (3) MG/3ML IN SOLN
3.0000 mL | Freq: Once | RESPIRATORY_TRACT | Status: AC
  Administered 2015-09-12: 3 mL via RESPIRATORY_TRACT

## 2015-09-12 MED ORDER — IBUPROFEN 800 MG PO TABS
ORAL_TABLET | ORAL | Status: AC
  Administered 2015-09-12: 800 mg via ORAL
  Filled 2015-09-12: qty 1

## 2015-09-12 MED ORDER — PREDNISONE 20 MG PO TABS
60.0000 mg | ORAL_TABLET | Freq: Once | ORAL | Status: AC
  Administered 2015-09-12: 60 mg via ORAL
  Filled 2015-09-12: qty 3

## 2015-09-12 MED ORDER — ALBUTEROL SULFATE HFA 108 (90 BASE) MCG/ACT IN AERS
2.0000 | INHALATION_SPRAY | RESPIRATORY_TRACT | Status: DC | PRN
  Filled 2015-09-12: qty 6.7

## 2015-09-12 MED ORDER — MAGNESIUM HYDROXIDE 400 MG/5ML PO SUSP
30.0000 mL | Freq: Every day | ORAL | Status: DC | PRN
Start: 2015-09-12 — End: 2015-09-13

## 2015-09-12 MED ORDER — IBUPROFEN 800 MG PO TABS
800.0000 mg | ORAL_TABLET | Freq: Once | ORAL | Status: AC
  Administered 2015-09-12: 800 mg via ORAL

## 2015-09-12 MED ORDER — ALBUTEROL SULFATE HFA 108 (90 BASE) MCG/ACT IN AERS
2.0000 | INHALATION_SPRAY | RESPIRATORY_TRACT | Status: DC | PRN
  Administered 2015-09-12 – 2015-09-13 (×3): 2 via RESPIRATORY_TRACT
  Filled 2015-09-12: qty 6.7

## 2015-09-12 NOTE — Consult Note (Signed)
[facility] Face-to-Face Psychiatry Consult   Reason for Consult:  Consult for this 60 year old man who presents to the emergency room complaining of a mixture of anxiety and depression with thoughts of suicide Referring Physician:  Paduchowski Patient Identification: Mark Sellers MRN:  [id number] Principal Diagnosis: Adjustment disorder with mixed anxiety and depressed mood Diagnosis:   Patient Active Problem List   Diagnosis Date Noted  . Suicidal ideation [R45.851] 09/12/2015  . Adjustment disorder with mixed anxiety and depressed mood [F43.23] 09/12/2015  . Panic attack [F41.0] 09/12/2015  . Homeless [Z59.0] 09/12/2015    Total Time spent with patient: 1 hour  Subjective:   Mark Sellers is a 60 y.o. male patient admitted with "I don't know what hit me I just felt like I was panicking".  HPI:  Patient interviewed. Chart reviewed. Old chart in the form of the recent emergency room notes reviewed. Labs and vitals reviewed. Patient states that this morning he was walking outside on his way to a convenience store and suddenly felt overwhelmed and panicky. He felt like his heart was racing. He felt like he was going to die. He also was suddenly consumed by thoughts of killing himself. Thought about jumping out in front of traffic or cutting his arms. Patient is not currently taking any psychiatric medicine. He states that he had been hearing voices in his head when he was sitting in the woods last night that were also telling him to kill himself but he denies that he's having hallucinations right now. Patient admits that he has a problem with alcohol but says that he hasn't had anything to drink in several days except for one standard sized beer last night. He denies that he's been using any other drugs. His drug screen is positive for cannabis. Patient is stating that he feels unsafe and like he can't continue to survive safely in his current situation of being out in the woods.  Medical history:  Patient claims that he has never had any significant medical care in the past which is remarkable because his COPD is pretty bad. He been here in the emergency room at least once may be more than that in the last couple days primarily for COPD. They had given him a prescription for prednisone which evidently he has not filled because of not having any money. Patient continues to cough and be short of breath but reports that this is pretty much his baseline. [place] having any other medical problems.  Substance abuse history: Patient admits having a problem with alcohol. Denies ever having had a seizure or delirium tremens in the past. Tends to minimize the use overall although it looks like on one of his visits a few days ago it was more clear-cut that he had been using more recently. Eyes that he uses any other drugs although as I mentioned he does have cannabis in his drug screen.  Social history: Patient states that he has been living in the woods in our community for about 3 weeks. He reports that he is from a small town in [place] and that he came up here a few weeks ago believing that someone was going to give him a job and a place to live. Apparently that whole thing fell through and now he has no work no money and no place to stay. He reports that he does have a couple of siblings back in [place] but that they will not or cannot give him money for a bus ticket.  Past Psychiatric  History: Patient denies any past history of any psychiatric treatment or evaluation. Denies any history of suicide attempts. Denies any history of violence. Denies ever being in a psychiatric hospital. He even denies ever having any formal substance abuse treatment or detox.  Risk to Self: Is patient at risk for suicide?: Yes Risk to Others:   Prior Inpatient Therapy:   Prior Outpatient Therapy:    Past Medical History:  Past Medical History  Diagnosis Date  . COPD (chronic obstructive pulmonary disease) ([place])   .  Anxiety    History reviewed. No pertinent past surgical history. Family History: History reviewed. No pertinent family history. Family Psychiatric  History: Patient denies any family history of mental health or substance abuse problems Social History:  History  Alcohol Use  . Yes     History  Drug Use No    Social History   Social History  . Marital Status: Married    Spouse Name: N/A  . Number of Children: N/A  . Years of Education: N/A   Social History Main Topics  . Smoking status: Current Every Day Smoker -- 0.50 packs/day    Types: Cigarettes  . Smokeless tobacco: None  . Alcohol Use: Yes  . Drug Use: No  . Sexual Activity: Not Asked   Other Topics Concern  . None   Social History Narrative   Additional Social History:    Allergies:   Allergies  Allergen Reactions  . Codeine     Nausea   . Penicillins     rash Has patient had a PCN reaction causing immediate rash, facial/tongue/throat swelling, SOB or lightheadedness with hypotension:  Unknown Has patient had a PCN reaction causing severe rash involving mucus membranes or skin necrosis: unknown Has patient had a PCN reaction that required hospitalization unknown Has patient had a PCN reaction occurring within the last 10 years: No If all of the above answers are "NO", then may proceed with Cephalosporin use.     Labs:  Results for orders placed or performed during the hospital encounter of 09/12/15 (from the past 48 hour(s))  Comprehensive metabolic panel     Status: Abnormal   Collection Time: 09/12/15 10:33 AM  Result Value Ref Range   Sodium 128 (L) 135 - 145 mmol/L   Potassium 4.6 3.5 - 5.1 mmol/L   Chloride 94 (L) 101 - 111 mmol/L   CO2 29 22 - 32 mmol/L   Glucose, Bld 96 65 - 99 mg/dL   BUN 8 6 - 20 mg/dL   Creatinine, Ser 0.75 0.61 - 1.24 mg/dL   Calcium 8.9 8.9 - 10.3 mg/dL   Total Protein 7.1 6.5 - 8.1 g/dL   Albumin 3.5 3.5 - 5.0 g/dL   AST 32 15 - 41 U/L   ALT 30 17 - 63 U/L    Alkaline Phosphatase 49 38 - 126 U/L   Total Bilirubin 0.7 0.3 - 1.2 mg/dL   GFR calc non Af Amer >60 >60 mL/min   GFR calc Af Amer >60 >60 mL/min    Comment: (NOTE) The eGFR has been calculated using the CKD EPI equation. This calculation has not been validated in all clinical situations. eGFR's persistently <60 mL/min signify possible Chronic Kidney Disease.    Anion gap 5 5 - 15  Ethanol (ETOH)     Status: None   Collection Time: 09/12/15 10:33 AM  Result Value Ref Range   Alcohol, Ethyl (B) <5 <5 mg/dL    Comment:  LOWEST DETECTABLE LIMIT FOR SERUM ALCOHOL IS 5 mg/dL FOR MEDICAL PURPOSES ONLY   Salicylate level     Status: None   Collection Time: 09/12/15 10:33 AM  Result Value Ref Range   Salicylate Lvl <9.4 2.8 - 30.0 mg/dL  Acetaminophen level     Status: Abnormal   Collection Time: 09/12/15 10:33 AM  Result Value Ref Range   Acetaminophen (Tylenol), Serum <10 (L) 10 - 30 ug/mL    Comment:        THERAPEUTIC CONCENTRATIONS VARY SIGNIFICANTLY. A RANGE OF 10-30 ug/mL MAY BE AN EFFECTIVE CONCENTRATION FOR MANY PATIENTS. HOWEVER, SOME ARE BEST TREATED AT CONCENTRATIONS OUTSIDE THIS RANGE. ACETAMINOPHEN CONCENTRATIONS >150 ug/mL AT 4 HOURS AFTER INGESTION AND >50 ug/mL AT 12 HOURS AFTER INGESTION ARE OFTEN ASSOCIATED WITH TOXIC REACTIONS.   CBC     Status: Abnormal   Collection Time: 09/12/15 10:33 AM  Result Value Ref Range   WBC 12.8 (H) 3.8 - 10.6 K/uL   RBC 4.28 (L) 4.40 - 5.90 MIL/uL   Hemoglobin 14.4 13.0 - 18.0 g/dL   HCT 42.8 40.0 - 52.0 %   MCV 99.9 80.0 - 100.0 fL   MCH 33.7 26.0 - 34.0 pg   MCHC 33.7 32.0 - 36.0 g/dL   RDW 14.3 11.5 - 14.5 %   Platelets 324 150 - 440 K/uL  Urine Drug Screen, Qualitative ([facility] only)     Status: Abnormal   Collection Time: 09/12/15 10:33 AM  Result Value Ref Range   Tricyclic, Ur Screen NONE DETECTED NONE DETECTED   Amphetamines, Ur Screen NONE DETECTED NONE DETECTED   MDMA (Ecstasy)Ur Screen NONE DETECTED  NONE DETECTED   Cocaine Metabolite,Ur [place] NONE DETECTED NONE DETECTED   Opiate, Ur Screen NONE DETECTED NONE DETECTED   Phencyclidine (PCP) Ur S NONE DETECTED NONE DETECTED   Cannabinoid 50 Ng, Ur  POSITIVE (A) NONE DETECTED   Barbiturates, Ur Screen NONE DETECTED NONE DETECTED   Benzodiazepine, Ur Scrn POSITIVE (A) NONE DETECTED   Methadone Scn, Ur NONE DETECTED NONE DETECTED    Comment: (NOTE) 765  Tricyclics, urine               Cutoff 1000 ng/mL 200  Amphetamines, urine             Cutoff 1000 ng/mL 300  MDMA (Ecstasy), urine           Cutoff 500 ng/mL 400  Cocaine Metabolite, urine       Cutoff 300 ng/mL 500  Opiate, urine                   Cutoff 300 ng/mL 600  Phencyclidine (PCP), urine      Cutoff 25 ng/mL 700  Cannabinoid, urine              Cutoff 50 ng/mL 800  Barbiturates, urine             Cutoff 200 ng/mL 900  Benzodiazepine, urine           Cutoff 200 ng/mL 1000 Methadone, urine                Cutoff 300 ng/mL 1100 1200 The urine drug screen provides only a preliminary, unconfirmed 1300 analytical test result and should not be used for non-medical 1400 purposes. Clinical consideration and professional judgment should 1500 be applied to any positive drug screen result due to possible 1600 interfering substances. A more specific alternate chemical method 1700 must be used in order  to obtain a confirmed analytical result.  1800 Gas chromato graphy / mass spectrometry (GC/MS) is the preferred 1900 confirmatory method.     Current Facility-Administered Medications  Medication Dose Route Frequency Provider Last Rate Last Dose  . ibuprofen (ADVIL,MOTRIN) 800 MG tablet           . ibuprofen (ADVIL,MOTRIN) tablet 800 mg  800 mg Oral Once Harvest Dark, MD       Current Outpatient Prescriptions  Medication Sig Dispense Refill  . albuterol (PROVENTIL HFA;VENTOLIN HFA) 108 (90 Base) MCG/ACT inhaler Inhale 2 puffs into the lungs every 6 (six) hours as needed for wheezing  or shortness of breath. 1 Inhaler 2  . chlordiazePOXIDE (LIBRIUM) 25 MG capsule 53m PO TID x 1D, then 25-589mPO BID X 1D, then 25-5060mO QD X 1D 10 capsule 0  . doxycycline (VIBRAMYCIN) 100 MG capsule Take 1 capsule (100 mg total) by mouth 2 (two) times daily. 20 capsule 0  . predniSONE (DELTASONE) 20 MG tablet Take 1 tablet (20 mg total) by mouth 2 (two) times daily. 10 tablet 0  . predniSONE (STERAPRED UNI-PAK 21 TAB) 10 MG (21) TBPK tablet Take 1 tablet (10 mg total) by mouth daily. Take steroid taper pak as directed 21 tablet 0    Musculoskeletal: Strength & Muscle Tone: decreased Gait & Station: normal Patient leans: N/A  Psychiatric Specialty Exam: Review of Systems  Constitutional: Negative.   HENT: Negative.   Eyes: Negative.   Respiratory: Positive for cough, sputum production and shortness of breath.   Cardiovascular: Negative.   Gastrointestinal: Negative.   Musculoskeletal: Negative.   Skin: Negative.   Neurological: Positive for tremors.  Psychiatric/Behavioral: Positive for depression, suicidal ideas, hallucinations and substance abuse. Negative for memory loss. The patient is nervous/anxious and has insomnia.     Blood pressure 127/90, pulse 65, temperature 98.2 F (36.8 C), temperature source Oral, resp. rate 18, height 6' (1.829 m), weight 64.411 kg (142 lb), SpO2 97 %.Body mass index is 19.25 kg/(m^2).  General Appearance: Disheveled and Patient is not only disheveled but clearly dirty in a way that would be consistent with having been living outdoors recently. He is wheezing but doesn't appear to be in severe distress.  Eye Contact::  Fair  Speech:  Slow  Volume:  Normal  Mood:  Depressed  Affect:  Depressed  Thought Process:  Goal Directed  Orientation:  Full (Time, Place, and Person)  Thought Content:  Hallucinations: Auditory  Suicidal Thoughts:  Yes.  with intent/plan  Homicidal Thoughts:  No  Memory:  Immediate;   Fair Recent;   Fair Remote;   Fair   Judgement:  Fair  Insight:  Fair  Psychomotor Activity:  Normal  Concentration:  Fair  Recall:  [employer] Knowledge:Fair  Language: Fair  Akathisia:  No  Handed:  Right  AIMS (if indicated):     Assets:  Communication Skills Desire for Improvement Resilience  ADL's:  Intact  Cognition: WNL  Sleep:      Treatment Plan Summary: Daily contact with patient to assess and evaluate symptoms and progress in treatment, Medication management and Plan Patient appears to be anxious and dysphoric. Has pretty severe stress on him. He is reporting having some suicidal thoughts. Also reports vague hallucinations although they are not currently present. Patient has a problem with alcohol but doesn't appear to be an acute withdrawal. Neither is he intoxicated. His suicidal ideation is a reasonable justification for admission to the hospital. Patient will be  admitted to psychiatry. 15 minute checks because of suicidality. I will defer starting medication because I'm not sure if he is really going to need antidepressants and I doubt he would be able to afford them anyway. We can monitor to see if alcohol withdrawal becomes a bigger problem. He can be engaged in groups and work with social work on an appropriate discharge plan. Will also go ahead and order some breathing treatments and we can give him the prednisone that he was supposed to be taking while he is in the hospital.  Disposition: Recommend psychiatric Inpatient admission when medically cleared. Supportive therapy provided about ongoing stressors.  Alethia Berthold, MD 09/12/2015 1:55 PM

## 2015-09-12 NOTE — ED Notes (Signed)
Patient currently in room resting. No signs of distress noted. Will continue to monitor for safety and complete Q15 minute checks.

## 2015-09-12 NOTE — ED Notes (Signed)
Patient currently states that he feels suicidal but does contract for safety while in the hospital stating that he does need help. Denies HI and AVH currently. All belongings sent with patient to inpatient unit.

## 2015-09-12 NOTE — ED Notes (Signed)
Patient currently endorses suicidal ideation but does not feel homicidal ideation at this time. He claims to have heard auditory hallucinations overnight stating that they were contributing to his suicidal ideation. He endorses depression and anxiety and states that this is the first time he has felt this way in a while. Patient also has a deep cough related to COPD and is coughing up mucus into a small bag intermittently throughout the day.  Patient is calm and pleasant. Will continue to monitor. Maintained on 15 minute checks and observation by security camera for safety.

## 2015-09-12 NOTE — ED Provider Notes (Signed)
[facility] Emergency Department Provider Note  Time seen: 11:10 AM  I have reviewed the triage vital signs and the nursing notes.   HISTORY  Chief Complaint Suicidal    HPI Mark Sellers is a 60 y.o. male homeless male with a past medical history of COPD, anxiety, presents the emergency department with shortness of breath and suicidal ideation. This is the patient's fourth emergency department visit in the past 6 days. Patient states this is the first time he has felt suicidal. He states he is homeless, does not have any money, now feels like killing himself. Patient also states he is feeling short of breath but states this is nothing new, he was prescribed prednisone 2 days ago from the emergency department for a COPD exacerbation but has been unable to fill it due to finances. Describes her shortness of breath is mild to moderate, but largely unchanged from several days ago. Patient has no plan to kill himself, states he is having thoughts to kill himself but no specific manner.    Past Medical History  Diagnosis Date  . COPD (chronic obstructive pulmonary disease) (HCC)   . Anxiety     There are no active problems to display for this patient.   History reviewed. No pertinent past surgical history.  Current Outpatient Rx  Name  Route  Sig  Dispense  Refill  . albuterol (PROVENTIL HFA;VENTOLIN HFA) 108 (90 Base) MCG/ACT inhaler   Inhalation   Inhale 2 puffs into the lungs every 6 (six) hours as needed for wheezing or shortness of breath.   1 Inhaler   2   . chlordiazePOXIDE (LIBRIUM) 25 MG capsule       PO TID x 1D, then 25-50mg  PO BID X 1D, then 25-50mg  PO QD X 1D   10 capsule   0     For symptoms of alcohol withdrawal   . doxycycline (VIBRAMYCIN) 100 MG capsule   Oral   Take 1 capsule (100 mg total) by mouth 2 (two) times daily.   20 capsule   0   . predniSONE (DELTASONE) 20 MG tablet   Oral   Take 1 tablet (20 mg total) by mouth 2  (two) times daily.   10 tablet   0   . predniSONE (STERAPRED UNI-PAK 21 TAB) 10 MG (21) TBPK tablet   Oral   Take 1 tablet (10 mg total) by mouth daily. Take steroid taper pak as directed   21 tablet   0     Allergies Codeine and Penicillins  History reviewed. No pertinent family history.  Social History Social History  Substance Use Topics  . Smoking status: Current Every Day Smoker -- 0.50 packs/day    Types: Cigarettes  . Smokeless tobacco: None  . Alcohol Use: Yes    Review of Systems Constitutional: Negative for fever. Cardiovascular: Negative for chest pain. Respiratory: Positive for shortness of breath. Gastrointestinal: Negative for abdominal pain Neurological: Negative for headache 10-point ROS otherwise negative.  ____________________________________________   PHYSICAL EXAM:  VITAL SIGNS: ED Triage Vitals  Enc Vitals Group     BP 09/12/15 1025 158/87 mmHg     Pulse Rate 09/12/15 1025 74     Resp 09/12/15 1025 18     Temp 09/12/15 1025 98 F (36.7 C)     Temp Source 09/12/15 1025 Oral     SpO2 09/12/15 1025 96 %     Weight 09/12/15 1025 142 lb (64.411 kg)     Height 09/12/15  1025 6' (1.829 m)     Head Cir --      Peak Flow --      Pain Score 09/12/15 1035 4     Pain Loc --      Pain Edu? --      Excl. in GC? --     Constitutional: Alert and oriented. Disheveled appearance. Eyes: Normal exam ENT   Head: Normocephalic and atraumatic.   Mouth/Throat: Mucous membranes are moist. Cardiovascular: Normal rate, regular rhythm. No murmur Respiratory: Mild expiratory wheeze bilaterally. No tachypnea. No respiratory distress. Gastrointestinal: Soft and nontender. No distention.   Musculoskeletal: Nontender with normal range of motion in all extremities.  Neurologic:  Normal speech and language. No gross focal neurologic deficits Skin:  Skin is warm, dry and intact.  Psychiatric: Mood and affect are normal. Speech and behavior are  normal.  ____________________________________________    INITIAL IMPRESSION / ASSESSMENT AND PLAN / ED COURSE  Pertinent labs & imaging results that were available during my care of the patient were reviewed by me and considered in my medical decision making (see chart for details).  Patient presents for suicidal ideation. We'll place the patient under an involuntary commitment until he can be adequately evaluated by psychiatry. As far as the shortness of breath the patient's symptoms he states are largely unchanged we will dose his prednisone which she has been unable to fill. We'll have psychiatry see the patient.  ____________________________________________   FINAL CLINICAL IMPRESSION(S) / ED DIAGNOSES  COPD Suicidal ideation   Minna Antis, MD 09/12/15 1113

## 2015-09-12 NOTE — BH Assessment (Signed)
Assessment Note  Mark Sellers is an 60 y.o. male who presents to ER via Patent [employer], after being seen at [facility]). Patient reported to them, he was having thoughts of self-harm.  He didn't give them a specific plan but they felt he was unsafe and a danger to his self.  Patient recently moved to [facility] from [place] with the understanding he was going to have job. Patient is currently without a job and homeless. He states, he is feeling helpless, hopeless and worthless. He's been having the thoughts of killing his self, with the plan of cutting his arm and or neck. At this time, patient is unable to contract for safety. If he was to discharge from the ER, he will more than likely harm his self.  Patient denies any past treatment for mental health or substance abuse problems. He denies having any current involvement with the legal system. He has no history of violence nor aggression. He denies HI and V/H. On last night, he states, he heard voices for the first time. "They was like a mumble." Patient also admits to drinking a 12oz beer as well. He drinks approximately 2 days out of the week and it's in the amount of  pint of liquor.  Diagnosis: Depression  Past Medical History:  Past Medical History  Diagnosis Date  . COPD (chronic obstructive pulmonary disease) (HCC)   . Anxiety     History reviewed. No pertinent past surgical history.  Family History: History reviewed. No pertinent family history.  Social History:  reports that he has been smoking Cigarettes.  He has been smoking about 0.50 packs per day. He does not have any smokeless tobacco history on file. He reports that he drinks alcohol. He reports that he does not use illicit drugs.  Additional Social History:  Alcohol / Drug Use Pain Medications: See PTA Prescriptions: See PTA Over the Counter: See PTA History of alcohol / drug use?: Yes Longest period of sobriety (when/how long): 2 months Negative  Consequences of Use: Financial, Legal, Personal relationships Withdrawal Symptoms:  (None Reported) Substance #1 Name of Substance 1: Alcohol 1 - Age of First Use: 18 1 - Amount (size/oz): 1/2 pint 1 - Frequency: 2 days out of the week 1 - Duration: Approximately a year 1 - Last Use / Amount: 09/11/2015  CIWA: CIWA-Ar BP: 127/90 mmHg Pulse Rate: 65 COWS:    Allergies:  Allergies  Allergen Reactions  . Codeine     Nausea   . Penicillins     rash Has patient had a PCN reaction causing immediate rash, facial/tongue/throat swelling, SOB or lightheadedness with hypotension:  Unknown Has patient had a PCN reaction causing severe rash involving mucus membranes or skin necrosis: unknown Has patient had a PCN reaction that required hospitalization unknown Has patient had a PCN reaction occurring within the last 10 years: No If all of the above answers are "NO", then may proceed with Cephalosporin use.     Home Medications:  (Not in a hospital admission)  OB/GYN Status:  No LMP for male patient.  General Assessment Data Location of Assessment: [facility] ED TTS Assessment: In system Is this a Tele or Face-to-Face Assessment?: Face-to-Face Is this an Initial Assessment or a Re-assessment for this encounter?: Initial Assessment Marital status: Separated Maiden name: n/a Is patient pregnant?: No Pregnancy Status: No Living Arrangements: Other (Comment) (Homeless) Can pt return to current living arrangement?: Yes Admission Status: Involuntary Is patient capable of signing voluntary admission?: No  Referral Source: Self/Family/Friend Insurance type: Quality Plus  Medical Screening Exam [facility] Walk-in ONLY) Medical Exam completed: Yes  Crisis Care Plan Living Arrangements: Other (Comment) (Homeless) Legal Guardian: Other: (None) Name of Psychiatrist: None Name of Therapist: None  Education Status Is patient currently in school?: No Current Grade: n/a Highest grade of school  patient has completed: 11th Grade Name of school: n/a Contact person: n/a  Risk to self with the past 6 months Suicidal Ideation: Yes-Currently Present Has patient been a risk to self within the past 6 months prior to admission? : Yes Suicidal Intent: Yes-Currently Present Has patient had any suicidal intent within the past 6 months prior to admission? : Yes Is patient at risk for suicide?: Yes Suicidal Plan?: Yes-Currently Present Has patient had any suicidal plan within the past 6 months prior to admission? : Yes Specify Current Suicidal Plan: Cut wrist an arms Access to Means: Yes Specify Access to Suicidal Means: Own knife What has been your use of drugs/alcohol within the last 12 months?: Alcohol Previous Attempts/Gestures: No How many times?: 0 Other Self Harm Risks: None Reported Triggers for Past Attempts: None known (None Reported) Intentional Self Injurious Behavior: None Family Suicide History: No Recent stressful life event(s): Other (Comment), Loss (Comment), Financial Problems (Homesless) Persecutory voices/beliefs?: No Depression: Yes Depression Symptoms: Isolating, Guilt, Fatigue, Feeling worthless/self pity Substance abuse history and/or treatment for substance abuse?: Yes Suicide prevention information given to non-admitted patients: Not applicable  Risk to Others within the past 6 months Homicidal Ideation: No Does patient have any lifetime risk of violence toward others beyond the six months prior to admission? : No Thoughts of Harm to Others: No Current Homicidal Intent: No Current Homicidal Plan: No Access to Homicidal Means: No Identified Victim: None Reported History of harm to others?: No Assessment of Violence: None Noted Violent Behavior Description: None Reported Does patient have access to weapons?: Yes (Comment) ("I have a blade.") Criminal Charges Pending?: No Does patient have a court date: No Is patient on probation?:  No  Psychosis Hallucinations: Auditory Delusions: None noted  Mental Status Report Appearance/Hygiene: Disheveled, In hospital gown, In scrubs Eye Contact: Fair Motor Activity: Freedom of movement, Unremarkable Speech: Logical/coherent Level of Consciousness: Alert Mood: Depressed, Anxious, Sad, Pleasant Affect: Anxious, Appropriate to circumstance, Depressed Anxiety Level: Minimal Thought Processes: Coherent, Relevant Judgement: Partial Orientation: Person, Place, Time, Situation, Appropriate for developmental age Obsessive Compulsive Thoughts/Behaviors: Minimal  Cognitive Functioning Concentration: Normal Memory: Recent Intact, Remote Intact IQ: Average Insight: Fair Impulse Control: Poor Appetite: Fair Weight Loss: 0 Weight Gain: 0 Sleep: Decreased Total Hours of Sleep: 3 Vegetative Symptoms: None  ADLScreening [facility] Assessment Services) Patient's cognitive ability adequate to safely complete daily activities?: Yes Patient able to express need for assistance with ADLs?: Yes Independently performs ADLs?: Yes (appropriate for developmental age)  Prior Inpatient Therapy Prior Inpatient Therapy: No Prior Therapy Dates: n/a Prior Therapy Facilty/Provider(s): n/a Reason for Treatment: n/a  Prior Outpatient Therapy Prior Outpatient Therapy: No Prior Therapy Dates: n/a Prior Therapy Facilty/Provider(s): n/a Reason for Treatment: n/a Does patient have an ACCT team?: No Does patient have Intensive In-House Services?  : No Does patient have [facility] services? : No Does patient have P4CC services?: No  ADL Screening (condition at time of admission) Patient's cognitive ability adequate to safely complete daily activities?: Yes Is the patient deaf or have difficulty hearing?: No Does the patient have difficulty seeing, even when wearing glasses/contacts?: No Does the patient have difficulty concentrating, remembering, or making decisions?: No  Patient able to express need  for assistance with ADLs?: Yes Does the patient have difficulty dressing or bathing?: No Independently performs ADLs?: Yes (appropriate for developmental age) Does the patient have difficulty walking or climbing stairs?: No Weakness of Legs: None Weakness of Arms/Hands: None  Home Assistive Devices/Equipment Home Assistive Devices/Equipment: None  Therapy Consults (therapy consults require a physician order) PT Evaluation Needed: No OT Evalulation Needed: No SLP Evaluation Needed: No Abuse/Neglect Assessment (Assessment to be complete while patient is alone) Physical Abuse: Denies Verbal Abuse: Denies Sexual Abuse: Denies Exploitation of patient/patient's resources: Denies Self-Neglect: Denies Values / Beliefs Cultural Requests During Hospitalization: None Spiritual Requests During Hospitalization: None Consults Spiritual Care Consult Needed: No Social Work Consult Needed: No      Additional Information 1:1 In Past 12 Months?: No CIRT Risk: No Elopement Risk: No Does patient have medical clearance?: Yes  Child/Adolescent Assessment Running Away Risk: Denies (Patient is an adult)  Disposition:  Disposition Initial Assessment Completed for this Encounter: Yes Disposition of Patient: Other dispositions (Psych MD to see) Other disposition(s): Other (Comment) (Psych MD to see)  On Site Evaluation by:   Reviewed with Physician:    Lilyan Gilford, MS, LCAS, LPC, NCC, CCSI 09/12/2015 2:15 PM

## 2015-09-12 NOTE — ED Notes (Signed)

## 2015-09-12 NOTE — ED Notes (Signed)
Attempted to call report to inpatient unit, writer notified that unit would call back after receiving some admissions. Will continue to facilitate transport.

## 2015-09-12 NOTE — Progress Notes (Signed)
Patient new admit, who states he "moved here to stay with a friend and start a new job". Loss job and not staying at friends so is no homeless. Smoker and drinks alcohol(see assessment). Denies SI/HI at this time. States he drank alcohol last night, got scared, walked to hospital, wet his pants. Cooperative with admission interview and assessment. Skin check performed with no wounds or bruises. Skin check performed with no contraband. Orient to unit. Eats evening meal. Safety maintained.

## 2015-09-12 NOTE — Tx Team (Signed)
Initial Interdisciplinary Treatment Plan   PATIENT STRESSORS: Financial difficulties Substance abuse   PATIENT STRENGTHS: Capable of independent living Communication skills   PROBLEM LIST: Problem List/Patient Goals Date to be addressed Date deferred Reason deferred Estimated date of resolution  Depression  2/1           Substance Abuse  2/1                                          DISCHARGE CRITERIA:  Adequate post-discharge living arrangements Improved stabilization in mood, thinking, and/or behavior  PRELIMINARY DISCHARGE PLAN: Attend aftercare/continuing care group Placement in alternative living arrangements Return to previous living arrangement  PATIENT/FAMIILY INVOLVEMENT: This treatment plan has been presented to and reviewed with the patient, Mark Sellers, and/or family member, [name].  The patient and family have been given the opportunity to ask questions and make suggestions.  [name] 09/12/2015, 5:40 PM

## 2015-09-12 NOTE — ED Notes (Signed)
Pt to ed with c/o suicidal thoughts and auditory hallucinations since last night.  Denies HI.

## 2015-09-12 NOTE — ED Notes (Signed)
Patient is currently in the dayroom speaking on the phone. Patient is pacing and seems to be anxious. Will continue to monitor for agitation and increased anxiety. Maintained on 15 minute checks and observation by security camera for safety.

## 2015-09-13 DIAGNOSIS — F4323 Adjustment disorder with mixed anxiety and depressed mood: Principal | ICD-10-CM

## 2015-09-13 DIAGNOSIS — F1220 Cannabis dependence, uncomplicated: Secondary | ICD-10-CM

## 2015-09-13 DIAGNOSIS — J449 Chronic obstructive pulmonary disease, unspecified: Secondary | ICD-10-CM

## 2015-09-13 DIAGNOSIS — F1020 Alcohol dependence, uncomplicated: Secondary | ICD-10-CM

## 2015-09-13 DIAGNOSIS — F17200 Nicotine dependence, unspecified, uncomplicated: Secondary | ICD-10-CM

## 2015-09-13 MED ORDER — MOMETASONE FURO-FORMOTEROL FUM 100-5 MCG/ACT IN AERO: 2.0000 | INHALATION_SPRAY | Freq: Two times a day (BID) | RESPIRATORY_TRACT | Status: AC

## 2015-09-13 MED ORDER — MOMETASONE FURO-FORMOTEROL FUM 100-5 MCG/ACT IN AERO
2.0000 | INHALATION_SPRAY | Freq: Two times a day (BID) | RESPIRATORY_TRACT | Status: DC
  Filled 2015-09-13: qty 8.8

## 2015-09-13 MED ORDER — ALBUTEROL SULFATE HFA 108 (90 BASE) MCG/ACT IN AERS: 2.0000 | INHALATION_SPRAY | Freq: Four times a day (QID) | RESPIRATORY_TRACT | Status: AC | PRN

## 2015-09-13 MED ORDER — ALBUTEROL SULFATE (2.5 MG/3ML) 0.083% IN NEBU: 2.5000 mg | INHALATION_SOLUTION | Freq: Four times a day (QID) | RESPIRATORY_TRACT | Status: DC | PRN

## 2015-09-13 MED ORDER — MOMETASONE FURO-FORMOTEROL FUM 100-5 MCG/ACT IN AERO: 2.0000 | INHALATION_SPRAY | Freq: Two times a day (BID) | RESPIRATORY_TRACT | Status: DC

## 2015-09-13 NOTE — BHH Suicide Risk Assessment (Signed)
[facility] Admission Suicide Risk Assessment   Nursing information obtained from:    Demographic factors:    Current Mental Status:    Loss Factors:    Historical Factors:    Risk Reduction Factors:     Total Time spent with patient: 1 hour Principal Problem: Adjustment disorder with mixed anxiety and depressed mood Diagnosis:   Patient Active Problem List   Diagnosis Date Noted  . Alcohol use disorder, severe, dependence (HCC) [F10.20] 09/13/2015  . Cannabis use disorder, moderate, dependence (HCC) [F12.20] 09/13/2015  . Tobacco use disorder [F17.200] 09/13/2015  . COPD (chronic obstructive pulmonary disease) (HCC) [J44.9] 09/13/2015  . Adjustment disorder with mixed anxiety and depressed mood [F43.23] 09/12/2015   Subjective Data:   Continued Clinical Symptoms:  Alcohol Use Disorder Identification Test Final Score (AUDIT): 0 The "Alcohol Use Disorders Identification Test", Guidelines for Use in Primary Care, Second Edition.  World Science writer [facility]). Score between 0-7:  no or low risk or alcohol related problems. Score between 8-15:  moderate risk of alcohol related problems. Score between 16-19:  high risk of alcohol related problems. Score 20 or above:  warrants further diagnostic evaluation for alcohol dependence and treatment.   CLINICAL FACTORS:   Alcohol/Substance Abuse/Dependencies     Psychiatric Specialty Exam: ROS   COGNITIVE FEATURES THAT CONTRIBUTE TO RISK:  None    SUICIDE RISK:   Minimal: No identifiable suicidal ideation.  Patients presenting with no risk factors but with morbid ruminations; may be classified as minimal risk based on the severity of the depressive symptoms  PLAN OF CARE: will d/c today  I certify that inpatient services furnished can reasonably be expected to improve the patient's condition.   [name], MD 09/13/2015, 12:40 PM
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
## 2015-09-13 NOTE — BHH Suicide Risk Assessment (Signed)
[facility] Discharge Suicide Risk Assessment   Principal Problem: Adjustment disorder with mixed anxiety and depressed mood Discharge Diagnoses:  Patient Active Problem List   Diagnosis Date Noted  . Alcohol use disorder, severe, dependence (HCC) [F10.20] 09/13/2015  . Cannabis use disorder, moderate, dependence (HCC) [F12.20] 09/13/2015  . Tobacco use disorder [F17.200] 09/13/2015  . COPD (chronic obstructive pulmonary disease) (HCC) [J44.9] 09/13/2015  . Adjustment disorder with mixed anxiety and depressed mood [F43.23] 09/12/2015    Total Time spent with patient: 30 minutes    Psychiatric Specialty Exam: ROS                                                         Mental Status Per Nursing Assessment::   On Admission:     Demographic Factors:  Male, Caucasian, Low socioeconomic status and Unemployed  Loss Factors: Financial problems/change in socioeconomic status  Historical Factors: NA  Risk Reduction Factors:   NA  Continued Clinical Symptoms:  Alcohol/Substance Abuse/Dependencies  Cognitive Features That Contribute To Risk:  None    Suicide Risk:  Minimal: No identifiable suicidal ideation.  Patients presenting with no risk factors but with morbid ruminations; may be classified as minimal risk based on the severity of the depressive symptoms  Follow-up Information    Follow up with [facility].   [facility]  [facility], an i.d. to be admitted into long-term residential substance abuse treatment.   Contact information:   [address] [place], [place] [address] [phone] Fax: [phone]       [name], MD 09/13/2015, 2:24 PM

## 2015-09-13 NOTE — BHH Group Notes (Signed)
[facility] Group Notes:  (Nursing/MHT/Case Management/Adjunct)  Date:  09/13/2015  Time:  1:03 AM  Type of Therapy:  Group Therapy  Participation Level:  Active  Participation Quality:  Appropriate  Affect:  Appropriate  Cognitive:  Appropriate  Insight:  Appropriate  Engagement in Group:  Engaged  Modes of Intervention:  n/a  Summary of Progress/Problems:  Mark Sellers 09/13/2015, 1:03 AM

## 2015-09-13 NOTE — Progress Notes (Signed)
D: Patient has stayed isolative to room. He has c/o a cough and has a hx of COPD. He also c/o pain from coughing. Patient denies SI/HI/AVH.  A: Medication was given for pain. Inhaler was give 2x for cough. Encouragement was provided.  R: Patient was complaint with medication. He has remained calm and cooperative. Safety maintained with 15 min checks.

## 2015-09-13 NOTE — Progress Notes (Signed)
Recreation Therapy Notes  INPATIENT RECREATION THERAPY ASSESSMENT  Patient Details Name: Mark Sellers MRN: [id number] DOB: 1955-09-26 Today's Date: 09/13/2015  Patient Stressors: Family, Work, Other (Comment) (Homeless)  Coping Skills:   Isolate, Avoidance, Music, Sports, Other (Comment) (Read the Bible)  Personal Challenges: Problem-Solving, Self-Esteem/Confidence, Trusting Others  Leisure Interests (2+):  Individual - Other (Comment) (Fishing, reading)  Awareness of Community Resources:  No  Community Resources:     Current Use:    If no, Barriers?:    Patient Strengths:  no  Patient Identified Areas of Improvement:  Education  Current Recreation Participation:  Nothing  Patient Goal for Hospitalization:  To get better and breathe better  [place] of Residence:  [place] of Residence:  [place]   Current SI (including self-harm):  No  Current HI:  No  Consent to Intern Participation: N/A   Jacquelynn Cree, LRT/CTRS 09/13/2015, 4:53 PM

## 2015-09-13 NOTE — Progress Notes (Signed)
Pleasant and cooperative.  Denies SI/HI/AVH.  Denies depression. Discharge instructions given, verbalized understanding.  Prescriptions given.  Personal belongings returned.  Patient escorted off unit by this writer to meet friend to travel [facility] to shelter.
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
## 2015-09-13 NOTE — BHH Suicide Risk Assessment (Signed)
[facility] INPATIENT:  Family/Significant Other Suicide Prevention Education  Suicide Prevention Education:  Patient Refusal for Family/Significant Other Suicide Prevention Education: The patient Mark Sellers has refused to provide written consent for family/significant other to be provided Family/Significant Other Suicide Prevention Education during admission and/or prior to discharge.  Physician notified. Pt refused SPE from CSW.Dorothe Pea Talik Casique 09/13/2015, 2:08 PM

## 2015-09-13 NOTE — Progress Notes (Signed)
  [facility] Adult Case Management Discharge Plan :  Will you be returning to the same living situation after discharge:  No, pt will seek admittance to the [facility] At discharge, do you have transportation home?: Yes,  pt will be picked up by a friend Do you have the ability to pay for your medications: Yes,  pt will be provided with prescriptions at discharge  Release of information consent forms completed and in the chart;  Patient's signature needed at discharge.  Patient to Follow up at: Follow-up Information    Follow up with [facility].   Why:  [facility] discharge paperwork, an i.d. to be admitted into long-term residential substance abuse treatment.   Contact information:   [address] [place], [place] [address] [phone] Fax: [phone]      Next level of care provider has access to [facility] Link:no  Safety Planning and Suicide Prevention discussed: No, pt refused  Have you used any form of tobacco in the last 30 days? (Cigarettes, Smokeless Tobacco, Cigars, and/or Pipes): Yes  Has patient been referred to the Quitline?: Patient refused referral  Patient has been referred for addiction treatment: Yes  Mark Sellers 09/13/2015, 2:47 PM

## 2015-09-13 NOTE — Plan of Care (Signed)
Problem: Ineffective individual coping Goal: STG: Patient will remain free from self harm Outcome: Progressing Patient remained free from harm since admission.

## 2015-09-13 NOTE — Plan of Care (Signed)
Problem: Ineffective individual coping Goal: LTG: Patient will report a decrease in negative feelings Outcome: Progressing Pleasant and cooperative.  Denies SI and depression.

## 2015-09-13 NOTE — Tx Team (Signed)
Interdisciplinary Treatment Plan Update (Adult)        Date: 09/13/2015   Time Reviewed: 9:30 AM   Progress in Treatment: Improving  Attending groups: Yes  Participating in groups: Yes Taking medication as prescribed: Yes  Tolerating medication: Yes  Family/Significant other contact made: No, pt refused Patient understands diagnosis: Yes  Discussing patient identified problems/goals with staff: Yes  Medical problems stabilized or resolved: Yes  Denies suicidal/homicidal ideation: Yes  Issues/concerns per patient self-inventory: Yes  Other:   New problem(s) identified: N/A   Discharge Plan or Barriers: Pt plans to discharge and seek admittance into the [facility] in [place], [place] for residential substance abuse treatment  Reason for Continuation of Hospitalization:   Depression   Anxiety   Medication Stabilization   Comments: N/A   Estimated date of discharge: 09/13/15   Patient is a 60 year old male admitted for SI with a plan of cutting his wrists.. Patient lives in [place], [place]. Patient will benefit from crisis stabilization, medication evaluation, group therapy, and psycho education in addition to case management for discharge planning. Patient and CSW reviewed pt's identified goals and treatment plan. Pt verbalized understanding and agreed to treatment plan.  Pt is a single Caucasian male originally from [place] and who moved to [place] about a month ago. Since then the patient has been homeless. He has been in our emergency department 4 times over the last week. His prior visits were related to COPD. Patient presented to the emergency department on 09/12/15 reporting suicidal thoughts with a plan to cut his wrists and having auditory hallucinations of demons telling him to kill himself.  In the emergency department his urine toxicology screen was positive for benzodiazepines and cannabis. His alcohol level was below detection limit.  Patient denies having any  prior history of depression or any other mental illness. Patient denies prior history of suicidal attempts or psychiatric hospitalizations.  Today the patient is denying suicidality, homicidality or having auditory or visual hallucinations. Patient is requesting to stay in our unit until Monday as his girlfriend, who is a Marine scientist in [place], will not get out of work until Monday.  As far as substance abuse the patient has a past history of alcoholism he says that he has been able to cut down his use of alcohol for the last year and a half. Currently he has been drinking 2-3 beers a week. He denies the use of cannabis states that he has not smoked any marijuana in about a year. He smokes about 2 packs of cigarettes per day  Review of initial/current patient goals per problem list:  1. Goal(s): Patient will participate in aftercare plan   Met: Yes  Target date: 3-5 days post admission date   As evidenced by: Patient will participate within aftercare plan AEB aftercare provider and housing plan at discharge being identified.   2/2: Pt plans to discharge and seek admittance into the [facility] in [place], [place] for residential substance abuse treatment    2. Goal (s): Patient will exhibit decreased depressive symptoms and suicidal ideations.   Met: Adequate for discharge per MD.  Target date: 3-5 days post admission date   As evidenced by: Patient will utilize self-rating of depression at 3 or below and demonstrate decreased signs of depression or be deemed stable for discharge by MD.   2/2: Adequate for discharge per MD. Pt denies SI/HI.  Pt reports he is safe for discharge.      3.  Goal(s): Patient will demonstrate decreased signs and symptoms of anxiety.   Met: Adequate for discharge per MD.  Target date: 3-5 days post admission date   As evidenced by: Patient will utilize self-rating of anxiety at 3 or below and demonstrated decreased signs of anxiety, or be deemed stable for  discharge by MD   2/2: Adequate for discharge per MD.    4. Goal(s): Patient will demonstrate decreased signs of withdrawal due to substance abuse   Met: Yes  Target date: 3-5 days post admission date   As evidenced by: Patient will produce a CIWA/COWS score of 0, have stable vitals signs, and no symptoms of withdrawal   2/2: Per medical chart pt patient produced a CIWA/COWS score of 0, has stable vitals signs, and no symptoms of withdrawal    Attendees:  Patient:  Family:  Physician: Jerilee Hoh, MD     09/13/2015 9:30 AM  Nursing: Tami Lin, RN     09/13/2015 9:30 AM  Clinical Social Worker: Marylou Flesher, [place]  09/13/2015 9:30 AM  Nursing: Carolynn Sayers, RN    09/13/2015 9:30 AM  Nursing : Elige Radon, RN    09/13/2015 9:30 AM  Other:        09/13/2015 9:30 AM  Other:        09/13/2015 9:30 AM
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
## 2015-11-17 ENCOUNTER — Emergency Department
Admission: EM | Admit: 2015-11-17 | Discharge: 2015-11-17 | Disposition: A | Discharge status: Home / Self Care | Payer: PRIVATE HEALTH INSURANCE | Attending: Emergency Medicine | Admitting: Emergency Medicine

## 2015-11-17 ENCOUNTER — Encounter: Payer: Self-pay | Admitting: Emergency Medicine

## 2015-11-17 DIAGNOSIS — Z79899 Other long term (current) drug therapy: Secondary | ICD-10-CM | POA: Diagnosis not present

## 2015-11-17 DIAGNOSIS — F1220 Cannabis dependence, uncomplicated: Secondary | ICD-10-CM | POA: Insufficient documentation

## 2015-11-17 DIAGNOSIS — F4323 Adjustment disorder with mixed anxiety and depressed mood: Secondary | ICD-10-CM | POA: Insufficient documentation

## 2015-11-17 DIAGNOSIS — F1010 Alcohol abuse, uncomplicated: Secondary | ICD-10-CM | POA: Diagnosis present

## 2015-11-17 DIAGNOSIS — F17210 Nicotine dependence, cigarettes, uncomplicated: Secondary | ICD-10-CM | POA: Insufficient documentation

## 2015-11-17 DIAGNOSIS — J449 Chronic obstructive pulmonary disease, unspecified: Secondary | ICD-10-CM | POA: Insufficient documentation

## 2015-11-17 NOTE — ED Notes (Signed)
Pt will be discharged to lobby - pt has called [facility] to see if they have a detox space available. This nurse spoke with [facility] and they state they will call back and let him know if and when there is space

## 2015-11-17 NOTE — Discharge Instructions (Signed)
Alcohol Abuse and Nutrition °Alcohol abuse is any pattern of alcohol consumption that harms your health, relationships, or work. Alcohol abuse can affect how your body breaks down and absorbs nutrients from food by causing your liver to work abnormally. Additionally, many people who abuse alcohol do not eat enough carbohydrates, protein, fat, vitamins, and minerals. This can cause poor nutrition (malnutrition) and a lack of nutrients (nutrient deficiencies), which can lead to further complications. °Nutrients that are commonly lacking (deficient) among people who abuse alcohol include: °· Vitamins. °· Vitamin A. This is stored in your liver. It is important for your vision, metabolism, and ability to fight off infections (immunity). °· B vitamins. These include vitamins such as folate, thiamin, and niacin. These are important in new cell growth and maintenance. °· Vitamin C. This plays an important role in iron absorption, wound healing, and immunity. °· Vitamin D. This is produced by your liver, but you can also get vitamin D from food. Vitamin D is necessary for your body to absorb and use calcium. °· Minerals. °· Calcium. This is important for your bones and your heart and blood vessel (cardiovascular) function. °· Iron. This is important for blood, muscle, and nervous system functioning. °· Magnesium. This plays an important role in muscle and nerve function, and it helps to control blood sugar and blood pressure. °· Zinc. This is important for the normal function of your nervous system and digestive system (gastrointestinal tract). °Nutrition is an essential component of therapy for alcohol abuse. Your health care provider or dietitian will work with you to design a plan that can help restore nutrients to your body and prevent potential complications. °WHAT IS MY PLAN? °Your dietitian may develop a specific diet plan that is based on your condition and any other complications you may have. A diet plan will  commonly include: °· A balanced diet. °¨ Grains: 6-8 oz per day. °¨ Vegetables: 2-3 cups per day. °¨ Fruits: 1-2 cups per day. °¨ Meat and other protein: 5-6 oz per day. °¨ Dairy: 2-3 cups per day. °· Vitamin and mineral supplements. °WHAT DO I NEED TO KNOW ABOUT ALCOHOL AND NUTRITION? °· Consume foods that are high in antioxidants, such as grapes, berries, nuts, green tea, and dark green and orange vegetables. This can help to counteract some of the stress that is placed on your liver by consuming alcohol. °· Avoid food and drinks that are high in fat and sugar. Foods such as sugared soft drinks, salty snack foods, and candy contain empty calories. This means that they lack important nutrients such as protein, fiber, and vitamins. °· Eat frequent meals and snacks. Try to eat 5-6 small meals each day. °· Eat a variety of fresh fruits and vegetables each day. This will help you get plenty of water, fiber, and vitamins in your diet. °· Drink plenty of water and other clear fluids. Try to drink at least 48-64 oz (1.5-2 L) of water per day. °· If you are a vegetarian, eat a variety of protein-rich foods. Pair whole grains with plant-based proteins at meals and snacks to obtain the greatest nutrient benefit from your food. For example, eat rice with beans, put peanut butter on whole-grain toast, or eat oatmeal with sunflower seeds. °· Soak beans and whole grains overnight before cooking. This can help your body to absorb the nutrients more easily. °· Include foods fortified with vitamins and minerals in your diet. Commonly fortified foods include milk, orange juice, cereal, and bread. °· If you   are malnourished, your dietitian may recommend a high-protein, high-calorie diet. This may include: °· 2,000-3,000 calories (kilocalories) per day. °· 70-100 grams of protein per day. °· Your health care provider may recommend a complete nutritional supplement beverage. This can help to restore calories, protein, and vitamins to  your body. Depending on your condition, you may be advised to consume this instead of or in addition to meals. °· Limit your intake of caffeine. Replace drinks like coffee and black tea with decaffeinated coffee and herbal tea. °· Eat a variety of foods that are high in omega fatty acids. These include fish, nuts and seeds, and soybeans. These foods may help your liver to recover and may also stabilize your mood. °· Certain medicines may cause changes in your appetite, taste, and weight. Work with your health care provider and dietitian to make any adjustments to your medicines and diet plan. °· Include other healthy lifestyle choices in your daily routine. °· Be physically active. °· Get enough sleep. °· Spend time doing activities that you enjoy. °· If you are unable to take in enough food and calories by mouth, your health care provider may recommend a feeding tube. This is a tube that passes through your nose and throat, directly into your stomach. Nutritional supplement beverages can be given to you through the feeding tube to help you get the nutrients you need. °· Take vitamin or mineral supplements as recommended by your health care provider. °WHAT FOODS CAN I EAT? °Grains °Enriched pasta. Enriched rice. Fortified whole-grain bread. Fortified whole-grain cereal. Barley. Brown rice. Quinoa. Millet. °Vegetables °All fresh, frozen, and canned vegetables. Spinach. Kale. Artichoke. Carrots. Winter squash and pumpkin. Sweet potatoes. Broccoli. Cabbage. Cucumbers. Tomatoes. Sweet peppers. Green beans. Peas. Corn. °Fruits °All fresh and frozen fruits. Berries. Grapes. Mango. Papaya. Guava. Cherries. Apples. Bananas. Peaches. Plums. Pineapple. Watermelon. Cantaloupe. Oranges. Avocado. °Meats and Other Protein Sources °Beef liver. Lean beef. Pork. Fresh and canned chicken. Fresh fish. Oysters. Sardines. Canned tuna. Shrimp. Eggs with yolks. Nuts and seeds. Peanut butter. Beans and lentils. Soybeans.  Tofu. °Dairy °Whole, low-fat, and nonfat milk. Whole, low-fat, and nonfat yogurt. Cottage cheese. Sour cream. Hard and soft cheeses. °Beverages °Water. Herbal tea. Decaffeinated coffee. Decaffeinated green tea. 100% fruit juice. 100% vegetable juice. Instant breakfast shakes. °Condiments °Ketchup. Mayonnaise. Mustard. Salad dressing. Barbecue sauce. °Sweets and Desserts °Sugar-free ice cream. Sugar-free pudding. Sugar-free gelatin. °Fats and Oils °Butter. Vegetable oil, flaxseed oil, olive oil, and walnut oil. °Other °Complete nutrition shakes. Protein bars. Sugar-free gum. °The items listed above may not be a complete list of recommended foods or beverages. Contact your dietitian for more options. °WHAT FOODS ARE NOT RECOMMENDED? °Grains °Sugar-sweetened breakfast cereals. Flavored instant oatmeal. Fried breads. °Vegetables °Breaded or deep-fried vegetables. °Fruits °Dried fruit with added sugar. Candied fruit. Canned fruit in syrup. °Meats and Other Protein Sources °Breaded or deep-fried meats. °Dairy °Flavored milks. Fried cheese curds or fried cheese sticks. °Beverages °Alcohol. Sugar-sweetened soft drinks. Sugar-sweetened tea. Caffeinated coffee and tea. °Condiments °Sugar. Honey. Agave nectar. Molasses. °Sweets and Desserts °Chocolate. Cake. Cookies. Candy. °Other °Potato chips. Pretzels. Salted nuts. Candied nuts. °The items listed above may not be a complete list of foods and beverages to avoid. Contact your dietitian for more information. °  °This information is not intended to replace advice given to you by your health care provider. Make sure you discuss any questions you have with your health care provider. °  °Document Released: 05/22/2005 Document Revised: 08/18/2014 Document Reviewed: 02/28/2014 °Elsevier Interactive Patient   Education ©2016 [employer] ° °Alcohol Use Disorder °Alcohol use disorder is a mental disorder. It is not a one-time incident of heavy drinking. Alcohol use disorder is the  excessive and uncontrollable use of alcohol over time that leads to problems with functioning in one or more areas of daily living. People with this disorder risk harming themselves and others when they drink to excess. Alcohol use disorder also can cause other mental disorders, such as mood and anxiety disorders, and serious physical problems. People with alcohol use disorder often misuse other drugs.  °Alcohol use disorder is common and widespread. Some people with this disorder drink alcohol to cope with or escape from negative life events. Others drink to relieve chronic pain or symptoms of mental illness. People with a family history of alcohol use disorder are at higher risk of losing control and using alcohol to excess.  °Drinking too much alcohol can cause injury, accidents, and health problems. One drink can be too much when you are: °· Working. °· Pregnant or breastfeeding. °· Taking medicines. Ask your doctor. °· Driving or planning to drive. °SYMPTOMS  °Signs and symptoms of alcohol use disorder may include the following:  °· Consumption of alcohol in larger amounts or over a longer period of time than intended. °· Multiple unsuccessful attempts to cut down or control alcohol use.   °· A great deal of time spent obtaining alcohol, using alcohol, or recovering from the effects of alcohol (hangover). °· A strong desire or urge to use alcohol (cravings).   °· Continued use of alcohol despite problems at work, school, or home because of alcohol use.   °· Continued use of alcohol despite problems in relationships because of alcohol use. °· Continued use of alcohol in situations when it is physically hazardous, such as driving a car. °· Continued use of alcohol despite awareness of a physical or psychological problem that is likely related to alcohol use. Physical problems related to alcohol use can involve the brain, heart, liver, stomach, and intestines. Psychological problems related to alcohol use include  intoxication, depression, anxiety, psychosis, delirium, and dementia.   °· The need for increased amounts of alcohol to achieve the same desired effect, or a decreased effect from the consumption of the same amount of alcohol (tolerance). °· Withdrawal symptoms upon reducing or stopping alcohol use, or alcohol use to reduce or avoid withdrawal symptoms. Withdrawal symptoms include: °¨ Racing heart. °¨ Hand tremor. °¨ Difficulty sleeping. °¨ Nausea. °¨ Vomiting. °¨ Hallucinations. °¨ Restlessness. °¨ Seizures. °DIAGNOSIS °Alcohol use disorder is diagnosed through an assessment by your health care provider. Your health care provider may start by asking three or four questions to screen for excessive or problematic alcohol use. To confirm a diagnosis of alcohol use disorder, at least two symptoms must be present within a 12-month period. The severity of alcohol use disorder depends on the number of symptoms: °· Mild--two or three. °· Moderate--four or five. °· Severe--six or more. °Your health care provider may perform a physical exam or use results from lab tests to see if you have physical problems resulting from alcohol use. Your health care provider may refer you to a mental health professional for evaluation. °TREATMENT  °Some people with alcohol use disorder are able to reduce their alcohol use to low-risk levels. Some people with alcohol use disorder need to quit drinking alcohol. When necessary, mental health professionals with specialized training in substance use treatment can help. Your health care provider can help you decide how severe your alcohol use disorder is and   what type of treatment you need. The following forms of treatment are available:  °· Detoxification. Detoxification involves the use of prescription medicines to prevent alcohol withdrawal symptoms in the first week after quitting. This is important for people with a history of symptoms of withdrawal and for heavy drinkers who are likely to  have withdrawal symptoms. Alcohol withdrawal can be dangerous and, in severe cases, cause death. Detoxification is usually provided in a hospital or in-patient substance use treatment facility. °· Counseling or talk therapy. Talk therapy is provided by substance use treatment counselors. It addresses the reasons people use alcohol and ways to keep them from drinking again. The goals of talk therapy are to help people with alcohol use disorder find healthy activities and ways to cope with life stress, to identify and avoid triggers for alcohol use, and to handle cravings, which can cause relapse. °· Medicines. Different medicines can help treat alcohol use disorder through the following actions: °¨ Decrease alcohol cravings. °¨ Decrease the positive reward response felt from alcohol use. °¨ Produce an uncomfortable physical reaction when alcohol is used (aversion therapy). °· Support groups. Support groups are run by people who have quit drinking. They provide emotional support, advice, and guidance. °These forms of treatment are often combined. Some people with alcohol use disorder benefit from intensive combination treatment provided by specialized substance use treatment centers. Both inpatient and outpatient treatment programs are available. °  °This information is not intended to replace advice given to you by your health care provider. Make sure you discuss any questions you have with your health care provider. °  °Document Released: 09/04/2004 Document Revised: 08/18/2014 Document Reviewed: 11/04/2012 °Elsevier Interactive Patient Education ©2016 Elsevier Inc. ° °

## 2015-11-17 NOTE — ED Provider Notes (Signed)
[facility] Emergency Department Provider Note     Time seen: ----------------------------------------- 7:54 PM on 11/17/2015 -----------------------------------------    I have reviewed the triage vital signs and the nursing notes.   HISTORY  Chief Complaint Drug / Alcohol Assessment    HPI Mark Sellers is a 60 y.o. male who presents to ER for alcohol detox. Patient states she's been alcohol abuser for 3 years and his last drink was last night. Patient states he could drink a pint of liquor daily. Patient asked if he could stay the night in the psych ward while [facility]. He denies any recent illness, denies any other complaints.   Past Medical History  Diagnosis Date  . COPD (chronic obstructive pulmonary disease) (HCC)   . Anxiety     Patient Active Problem List   Diagnosis Date Noted  . Alcohol use disorder, severe, dependence (HCC) 09/13/2015  . Cannabis use disorder, moderate, dependence (HCC) 09/13/2015  . Tobacco use disorder 09/13/2015  . COPD (chronic obstructive pulmonary disease) (HCC) 09/13/2015  . Adjustment disorder with mixed anxiety and depressed mood 09/12/2015    No past surgical history on file.  Allergies Codeine and Penicillins  Social History Social History  Substance Use Topics  . Smoking status: Current Every Day Smoker -- 0.50 packs/day    Types: Cigarettes  . Smokeless tobacco: Not on file  . Alcohol Use: 1.2 oz/week    2 Cans of beer per week    Review of Systems Constitutional: Negative for fever. Eyes: Negative for visual changes. ENT: Negative for sore throat. Cardiovascular: Negative for chest pain. Respiratory: Negative for shortness of breath. Gastrointestinal: Negative for abdominal pain, vomiting and diarrhea. Genitourinary: Negative for dysuria. Musculoskeletal: Negative for back pain. Skin: Negative for rash. Neurological: Negative for headaches, focal weakness or  numbness. Psychiatric: Positive for chronic alcohol abuse  10-point ROS otherwise negative.  ____________________________________________   PHYSICAL EXAM:  VITAL SIGNS: ED Triage Vitals  Enc Vitals Group     BP 11/17/15 1948 130/83 mmHg     Pulse Rate 11/17/15 1948 79     Resp 11/17/15 1948 20     Temp 11/17/15 1948 98.3 F (36.8 C)     Temp Source 11/17/15 1948 Oral     SpO2 11/17/15 1948 96 %     Weight 11/17/15 1948 136 lb (61.689 kg)     Height 11/17/15 1948 5\' 11"  (1.803 m)     Head Cir --      Peak Flow --      Pain Score --      Pain Loc --      Pain Edu? --      Excl. in GC? --     Constitutional: Alert and oriented. Well appearing and in no distress. Eyes: Conjunctivae are normal. PERRL. Normal extraocular movements. Cardiovascular: Normal rate, regular rhythm. Normal and symmetric distal pulses are present in all extremities. No murmurs, rubs, or gallops. Respiratory: Normal respiratory effort without tachypnea nor retractions. Breath sounds are clear and equal bilaterally. No wheezes/rales/rhonchi. Gastrointestinal: Soft and nontender.Normal bowel sounds Musculoskeletal: Nontender with normal range of motion in all extremities. No joint effusions.  No lower extremity tenderness nor edema. Neurologic:  Normal speech and language. No gross focal neurologic deficits are appreciated. Patient can ambulate safely Skin:  Skin is warm, dry and intact. No rash noted. Psychiatric: Mood and affect are normal. Speech and behavior are normal. Patient exhibits appropriate insight and judgment. ____________________________________________  ED COURSE:  Pertinent labs & imaging results that were available during my care of the patient were reviewed by me and considered in my medical decision making (see chart for details). Patient is in no acute distress, does not appear to be in severe withdrawal. He appears stable for detox  referral. ____________________________________________  FINAL ASSESSMENT AND PLAN  Chronic alcohol abuse  Plan: Patient with chronic alcoholism. He is stable for referral to [facility].   Emily Filbert, MD   Emily Filbert, MD 11/17/15 [phone]

## 2015-11-17 NOTE — ED Notes (Signed)
etoh abuser x 3 years - was recommended to detox in [place]. Last drank last night. Asking if he could stay the night in the psych ward while we arranged a ride for him to [place].

## 2016-01-16 ENCOUNTER — Ambulatory Visit: Payer: PRIVATE HEALTH INSURANCE

## 2016-01-16 ENCOUNTER — Encounter (INDEPENDENT_AMBULATORY_CARE_PROVIDER_SITE_OTHER): Payer: Self-pay

## 2016-09-19 ENCOUNTER — Telehealth: Payer: Self-pay | Admitting: Pharmacist

## 2016-09-19 NOTE — Telephone Encounter (Signed)
Dulera PAP submitted to manufacturer today.

## 2016-11-03 ENCOUNTER — Telehealth: Payer: Self-pay | Admitting: Pharmacist

## 2016-11-03 NOTE — Telephone Encounter (Signed)
Faxed script to [facility] for refill on Ventolin HFA 90 mcg  Inhale 2 puffs every six hours as needed for wheezing, cough, or shortness of breath.

## 2016-12-15 ENCOUNTER — Telehealth: Payer: Self-pay | Admitting: Pharmacist

## 2016-12-15 NOTE — Telephone Encounter (Signed)
12/15/16 Called [facility] for refill on Dulera 100/5 mcg, will ship to patient.

## 2017-01-30 ENCOUNTER — Telehealth: Payer: Self-pay | Admitting: Pharmacist

## 2017-01-30 NOTE — Telephone Encounter (Signed)
01/30/17 Faxed Re Enrollment application to [facility] for Ventolin HFA 90 mcg Inhale 2 puffs every six hours as needed for wheezing, cough or shortness of breath.Forde RadonAJ

## 2017-03-26 IMAGING — CR DG CHEST 2V
2 series · 2 of 2 positions shown · non-contrast
Comparison: 09/08/2015

CLINICAL DATA: Persistent productive cough for 2 days.  COPD.

EXAM:
CHEST  2 VIEW

[chest pa]
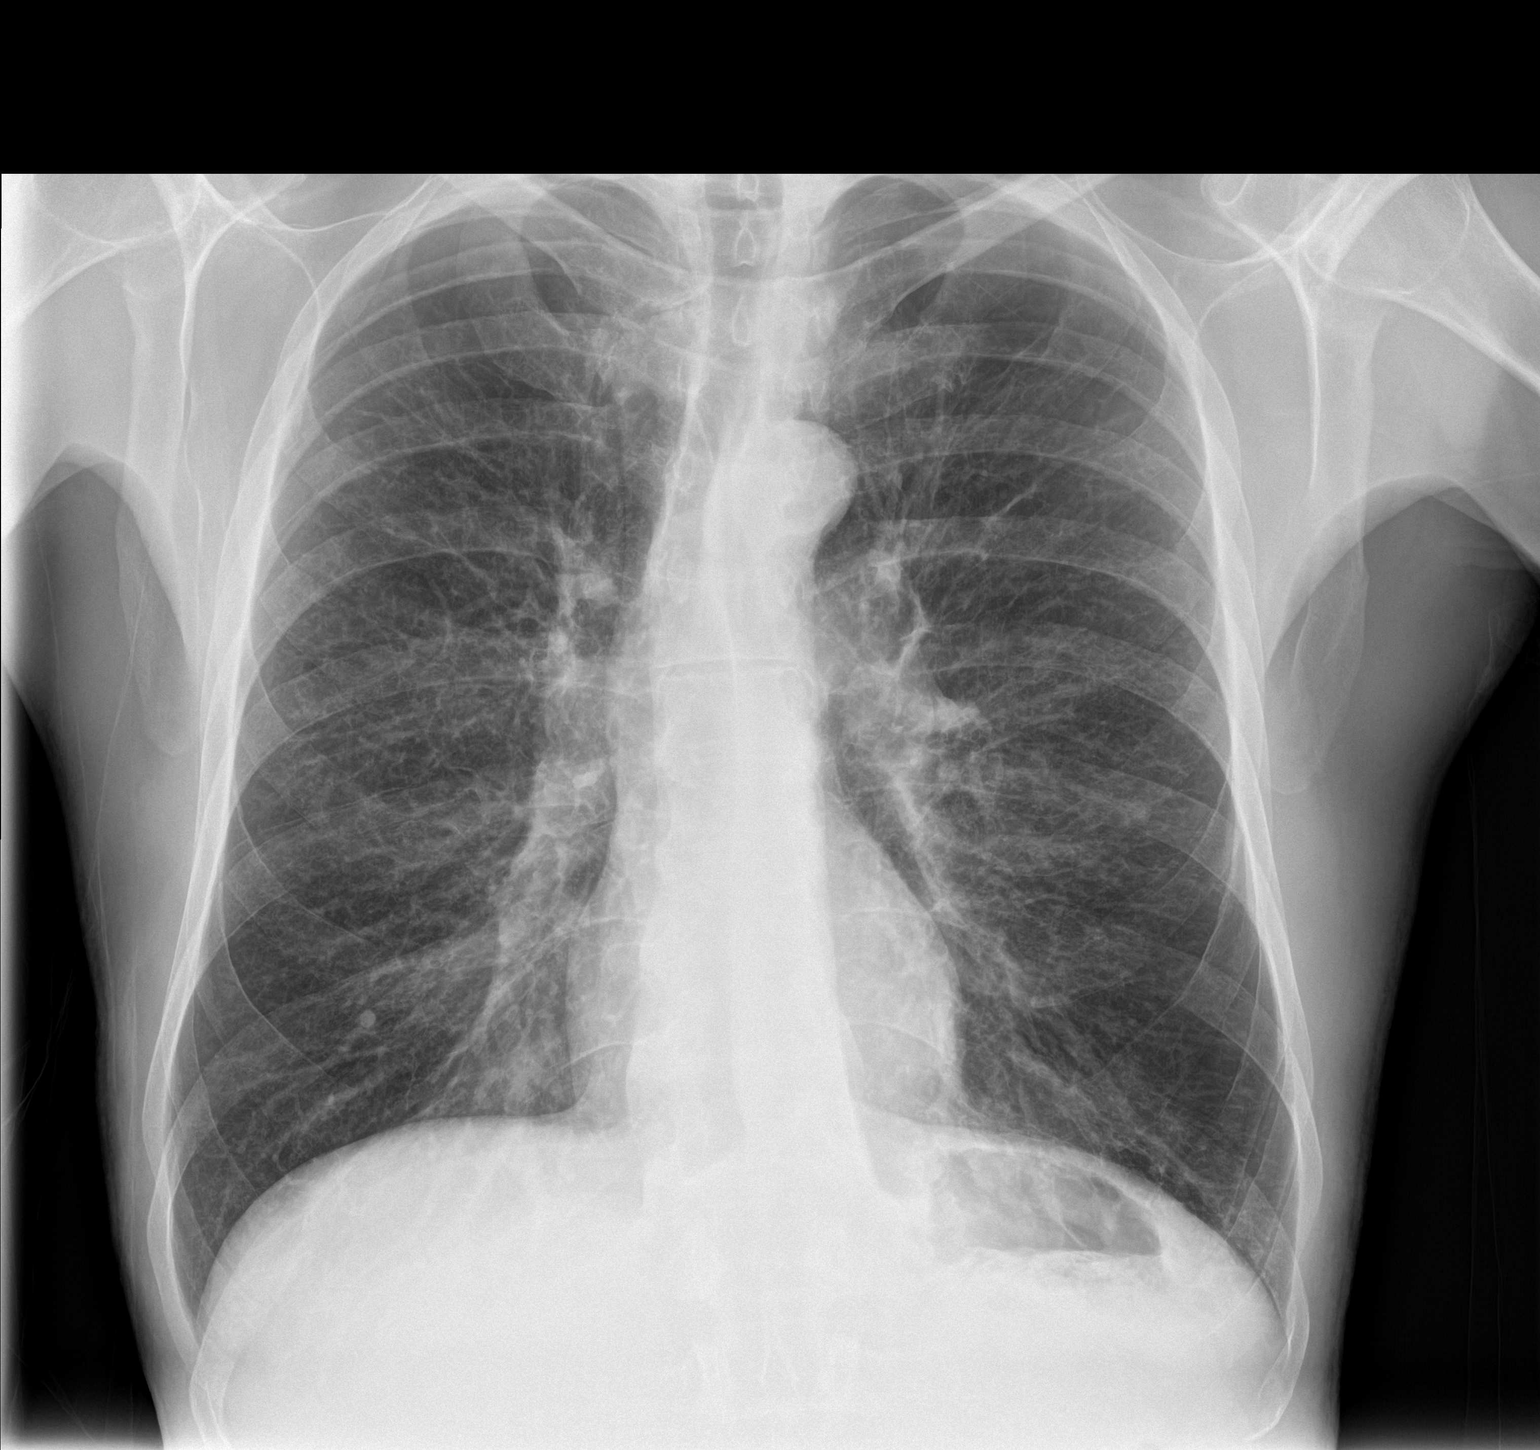

[chest lat]
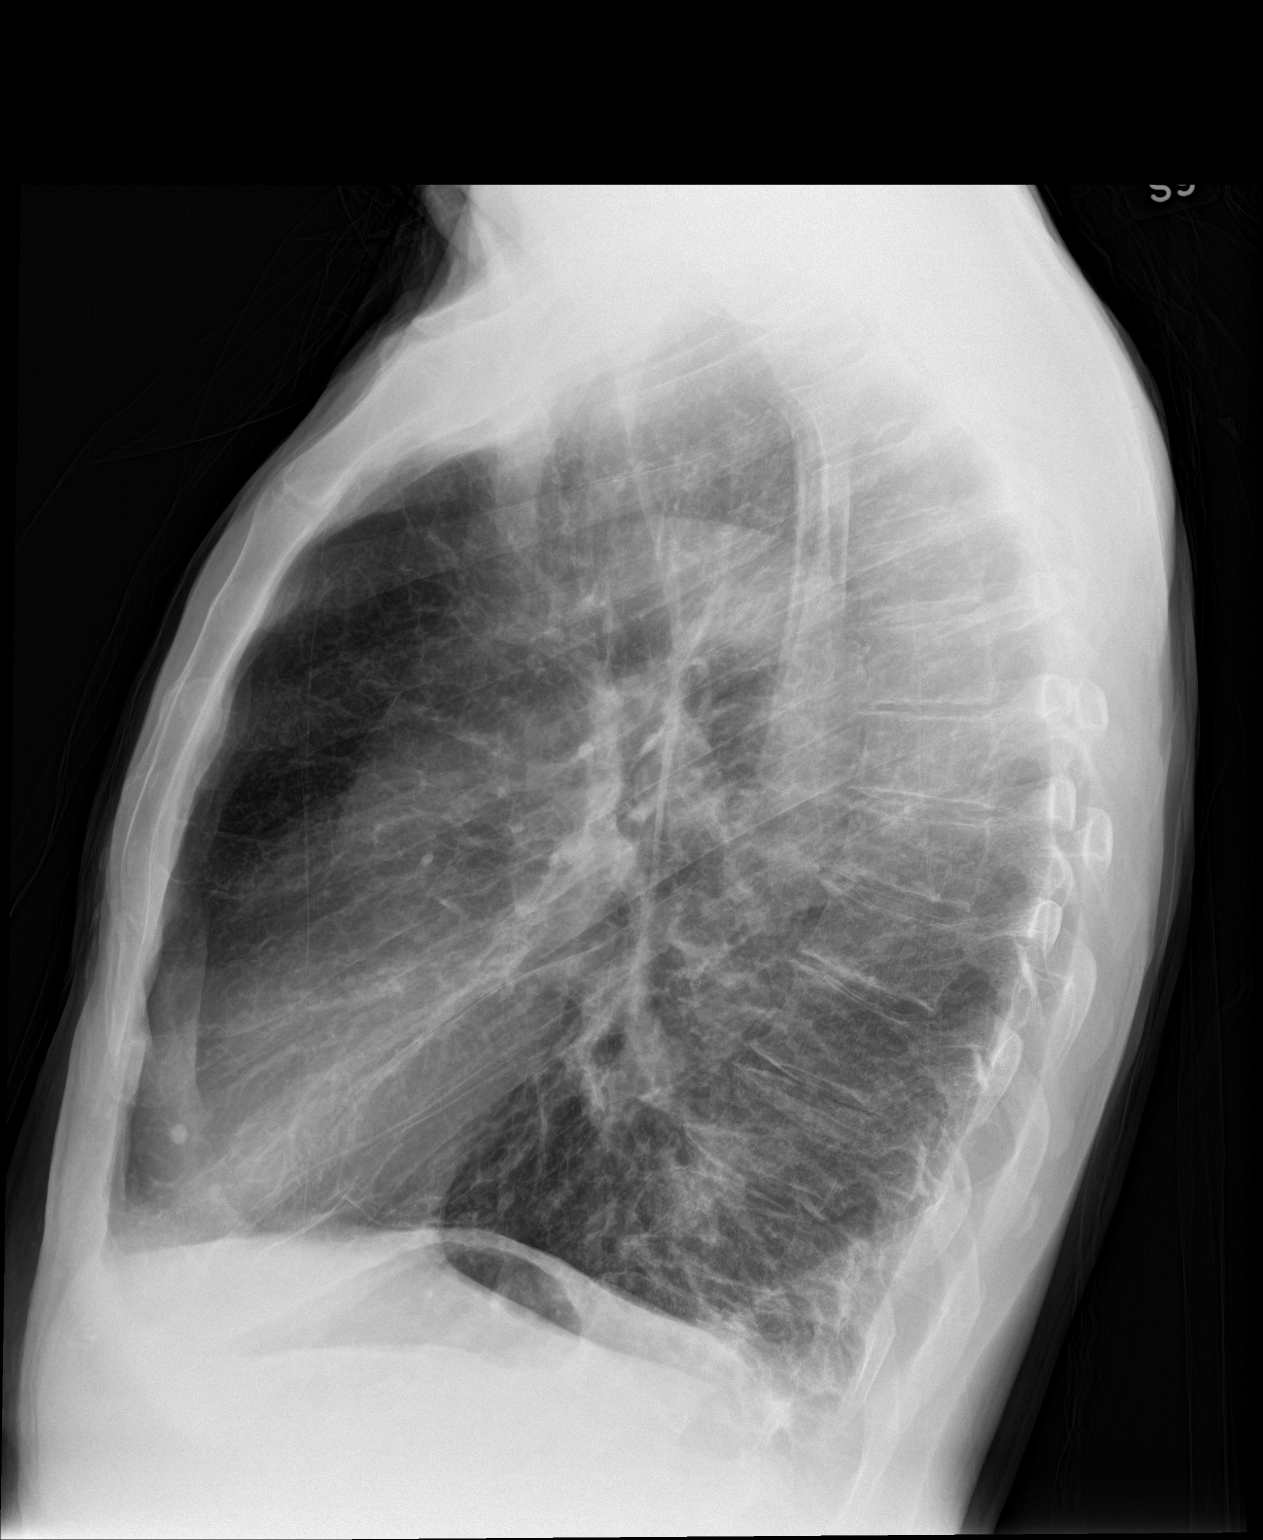

[2 of 2 positions shown; findings below may reference images not displayed]

FINDINGS: There is hyperinflation of the lungs compatible with COPD. Heart and
mediastinal contours are within normal limits. No focal opacities or
effusions. No acute bony abnormality.
IMPRESSION: COPD.  No active disease.

## 2017-07-07 ENCOUNTER — Telehealth: Payer: Self-pay | Admitting: Pharmacist

## 2017-07-07 NOTE — Telephone Encounter (Signed)
07/07/17 Placed refill online with [facility] for Ventolin HFA, set to release 07/20/17, order# [id number].Forde RadonAJ

## 2017-07-31 ENCOUNTER — Telehealth: Payer: Self-pay | Admitting: Pharmacist

## 2017-07-31 NOTE — Telephone Encounter (Signed)
07/31/17 Called [facility] to refill Dulera 100/5, rx# [id number]-this medication will ship to patient.Mark Sellers

## 2017-09-18 ENCOUNTER — Telehealth: Payer: Self-pay | Admitting: Pharmacist

## 2017-09-18 NOTE — Telephone Encounter (Signed)
09/18/17 Called [facility] for refill on [employer] TireDulera 100/725mcg Inhale 2 puffs twice daily, according to [place] the script expired 08/15/17 & enrollment ends 09/30/17. Need Renewal application. I have printed [employer] application mailing patient his portion to sign & return with income/support & taxes/[id number], also [facility] portion to sign & mail back to us.Forde RadonAJ

## 2017-10-12 ENCOUNTER — Telehealth: Payer: Self-pay | Admitting: Pharmacist

## 2017-10-12 NOTE — Telephone Encounter (Signed)
10/12/17 Printed script for Ventolin HFA 90mcg Inhale 2 puffs every six hours as needed for wheezing, cough or shortness of breath, mailing to Dr[facility] Lanier EnsignMeredith Soles [facility] to sign and return fir refill.Forde RadonAJ

## 2017-10-21 ENCOUNTER — Telehealth: Payer: Self-pay | Admitting: Pharmacy Technician

## 2017-10-21 NOTE — Telephone Encounter (Signed)
Received updated proof of income.  Patient eligible to receive medication assistance at [facility] through 2019, as long as eligibility requirements continue to be met.  [facility]

## 2017-10-22 ENCOUNTER — Telehealth: Payer: Self-pay | Admitting: Pharmacist

## 2017-10-22 NOTE — Telephone Encounter (Signed)
10/22/2017 11:04:11 AM - Elwin Sleightulera 100/5  10/22/17 Mailing [employer] application for Goodyear TireDulera 100/5 mcg Inhale 2 puffs twice daily #3.Forde RadonAJ

## 2017-11-16 ENCOUNTER — Telehealth: Payer: Self-pay | Admitting: Pharmacist

## 2017-11-16 NOTE — Telephone Encounter (Signed)
11/16/2017 9:55:03 AM - Ventolin refill  11/16/17 Faxed script to [facility] for refill on Ventolin HFA 90mcg Inhale 2 puffs every six hours as needed for wheezing, cough or shortness of breath.Forde RadonAJ

## 2019-03-23 ENCOUNTER — Telehealth: Payer: Self-pay | Admitting: Pharmacy Technician

## 2019-03-23 NOTE — Telephone Encounter (Signed)
Patient failed to provide2020 financial documentation. No additional medication assistance will be provided by [facility] without the required proof of income documentation. Patient notified by letter.  Emelina Hinch, CPhT [facility] 

## 2020-10-08 ENCOUNTER — Ambulatory Visit (INDEPENDENT_AMBULATORY_CARE_PROVIDER_SITE_OTHER): Payer: Self-pay

## 2020-10-08 ENCOUNTER — Other Ambulatory Visit: Payer: Self-pay

## 2020-10-08 ENCOUNTER — Encounter: Payer: Self-pay | Admitting: Emergency Medicine

## 2020-10-08 ENCOUNTER — Ambulatory Visit
Admission: EM | Admit: 2020-10-08 | Discharge: 2020-10-08 | Disposition: A | Discharge status: Home / Self Care | Payer: Self-pay | Attending: Physician Assistant | Admitting: Physician Assistant

## 2020-10-08 DIAGNOSIS — R030 Elevated blood-pressure reading, without diagnosis of hypertension: Secondary | ICD-10-CM

## 2020-10-08 DIAGNOSIS — J441 Chronic obstructive pulmonary disease with (acute) exacerbation: Secondary | ICD-10-CM

## 2020-10-08 DIAGNOSIS — R0602 Shortness of breath: Secondary | ICD-10-CM

## 2020-10-08 HISTORY — DX: Chronic obstructive pulmonary disease, unspecified: J44.9

## 2020-10-08 MED ORDER — PREDNISONE 20 MG PO TABS: 40.0000 mg | ORAL_TABLET | Freq: Every day | ORAL | 0 refills | Status: AC

## 2020-10-08 MED ORDER — ALBUTEROL SULFATE HFA 108 (90 BASE) MCG/ACT IN AERS: 1.0000 | INHALATION_SPRAY | Freq: Four times a day (QID) | RESPIRATORY_TRACT | 0 refills | Status: AC | PRN

## 2020-10-08 NOTE — ED Provider Notes (Signed)
[facility]    CSN: [id number] Arrival date & time: 10/08/20  1004      History   Chief Complaint Chief Complaint  Patient presents with  . Fall    DOI 10/07/20  . Shortness of Breath    HPI Mark Sellers is a 65 y.o. male presenting for shortness of breath.  He says he does have COPD but does not use an inhaler.  He says he has never used any inhalers.  He denies being any more short of breath recently, but states that he thought he would just follow-up with somebody about this and multiple other issues.  He does not per primary care provider and does not have any insurance.  Patient does admit that he has not been seen by a provider for over 6 years.  He does not take any routine medications.  Patient is a current smoker and has been for many years.  He also drinks alcohol on the weekends.  Besides COPD, patient denies any other known medical problems.  He denies any Covid exposure.  Patient does not report any fever, fatigue, body aches, sore throat, congestion, chest pain, wheezing, abdominal pain, nausea/vomiting or diarrhea.  He does state that he has a cough that is productive of phlegm, but no worse recently.  He has no other concerns today.  HPI  Past Medical History:  Diagnosis Date  . COPD (chronic obstructive pulmonary disease) (HCC)     There are no problems to display for this patient.   Past Surgical History:  Procedure Laterality Date  . APPENDECTOMY         Home Medications    Prior to Admission medications   Medication Sig Start Date End Date Taking? Authorizing Provider  albuterol (VENTOLIN HFA) 108 (90 Base) MCG/ACT inhaler Inhale 1-2 puffs into the lungs every 6 (six) hours as needed for wheezing or shortness of breath. 10/08/20 10/08/21 Yes Shirlee Latch, PA-C  predniSONE (DELTASONE) 20 MG tablet Take 2 tablets (40 mg total) by mouth daily for 5 days. 10/08/20 10/13/20 Yes Shirlee Latch, PA-C    Family History Family History  Problem  Relation Age of Onset  . Liver cancer Mother   . Other Father        unknown medical history    Social History Social History   Tobacco Use  . Smoking status: Current Every Day Smoker    Packs/day: 0.25    Types: Cigarettes  . Smokeless tobacco: Never Used  . Tobacco comment: patient rolls his own cigarettes  Vaping Use  . Vaping Use: Never used  Substance Use Topics  . Alcohol use: Yes    Comment: 1 pint on the weekends  . Drug use: Never     Allergies   Penicillins   Review of Systems Review of Systems  Constitutional: Negative for fatigue and fever.  HENT: Negative for congestion, rhinorrhea, sinus pressure, sinus pain and sore throat.   Respiratory: Positive for cough and shortness of breath. Negative for wheezing.   Gastrointestinal: Negative for abdominal pain, diarrhea, nausea and vomiting.  Musculoskeletal: Negative for myalgias.  Neurological: Negative for weakness, light-headedness and headaches.  Hematological: Negative for adenopathy.     Physical Exam Triage Vital Signs ED Triage Vitals  Enc Vitals Group     BP 10/08/20 1016 (!) 155/97     Pulse Rate 10/08/20 1016 98     Resp 10/08/20 1016 (!) 28     Temp 10/08/20 1016 97.9 F (36.6  C)     Temp Source 10/08/20 1016 Oral     SpO2 10/08/20 1016 95 %     Weight 10/08/20 1017 145 lb (65.8 kg)     Height 10/08/20 1017 5\' 10"  (1.778 m)     Head Circumference --      Peak Flow --      Pain Score 10/08/20 1016 0     Pain Loc --      Pain Edu? --      Excl. in GC? --    No data found.  Updated Vital Signs BP (!) 155/97 (BP Location: Left Arm)   Pulse 98   Temp 97.9 F (36.6 C) (Oral)   Resp (!) 28   Ht 5\' 10"  (1.778 m)   Wt 145 lb (65.8 kg)   SpO2 95%   BMI 20.81 kg/m       Physical Exam Vitals and nursing note reviewed.  Constitutional:      General: He is not in acute distress.    Appearance: Normal appearance. He is well-developed and well-nourished. He is not ill-appearing.   HENT:     Head: Normocephalic and atraumatic.     Nose: Nose normal.     Mouth/Throat:     Mouth: Mucous membranes are moist.     Pharynx: Oropharynx is clear.  Eyes:     General: No scleral icterus.    Conjunctiva/sclera: Conjunctivae normal.  Cardiovascular:     Rate and Rhythm: Normal rate and regular rhythm.     Heart sounds: Normal heart sounds.  Pulmonary:     Effort: Pulmonary effort is normal. Tachypnea present. No respiratory distress.     Breath sounds: Decreased breath sounds (decreased throughout) present.     Comments: Patient is breathing with pursed lips Musculoskeletal:        General: No edema.     Cervical back: Neck supple.  Skin:    General: Skin is warm and dry.  Neurological:     General: No focal deficit present.     Mental Status: He is alert. Mental status is at baseline.     Motor: No weakness.     Gait: Gait normal.  Psychiatric:        Mood and Affect: Mood and affect and mood normal.        Behavior: Behavior normal.        Thought Content: Thought content normal.      UC Treatments / Results  Labs (all labs ordered are listed, but only abnormal results are displayed) Labs Reviewed - No data to display  EKG   Radiology DG Chest 2 View  Result Date: 10/08/2020 CLINICAL DATA:  Shortness of breath EXAM: CHEST - 2 VIEW COMPARISON:  09/11/2015 FINDINGS: Cardiac shadow is within normal limits. The lungs are hyperinflated consistent with COPD. No focal infiltrate or sizable effusion is noted. Calcified granuloma is again seen in the right lung base and stable. No acute bony abnormality is noted. IMPRESSION: COPD without acute abnormality. Electronically Signed   By: 10/10/2020 M.D.   On: 10/08/2020 11:05    Procedures Procedures (including critical care time)  Medications Ordered in UC Medications - No data to display  Initial Impression / Assessment and Plan / UC Course  I have reviewed the triage vital signs and the nursing  notes.  Pertinent labs & imaging results that were available during my care of the patient were reviewed by me and considered in my medical decision making (  see chart for details).   65 year old male presenting for cough or shortness of breath for an extended period of time.  He does not have known COPD and does not use any inhalers.  His blood pressure is little elevated at 155/97 in the clinic.  He is tachypneic, but not hypoxic.  His oxygen is 95%.  He does have diminished breath sounds throughout his breathing with pursed lips.  A chest x-ray was performed today which does not show any acute changes and just notes underlying COPD.  I discussed this with the patient.  Advised patient that he needs to start on some inhalers and I can also send prednisone to the pharmacy for him.  I did offer him an albuterol inhaler in the clinic as well.  Patient declined all medications and stated  "I will just see how I do."  Patient advised of the importance of smoking cessation and the use of the inhalers.  I printed a prescription for the albuterol inhaler and prednisone for patient and advised him to consider starting these.  He is not wanting to do so at this time.  Patient has been given information for primary care provider and is trying to get insurance.  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  Advised patient that he needs to see a primary care provider so he can have routine labs performed on a general physical exam.  Patient seems to be understanding, but not so agreeable.  ED precautions for shortness of breath discussed with patient.  Final Clinical Impressions(s) / UC Diagnoses   Final diagnoses:  COPD exacerbation (HCC)  Shortness of breath  Elevated blood pressure reading     Discharge Instructions     Your chest x-ray did not show any acute changes.  You definitely have COPD as evidenced by your x-ray.  There is no pneumonia.  You are  having difficulty breathing I suggest that you use the albuterol inhaler if sent to the pharmacy.  I also sent prednisone to the pharmacy.  Increase your rest and fluids and try to quit smoking.  You need to get set up with a primary care provider.  You have been given information on how to go about doing this.  You do have other issues that you need addressed so I advised that you contact your primary care provider.  If you have any acute worsening of your breathing you need to go to the emergency department.    ED Prescriptions    Medication Sig Dispense Auth. Provider   predniSONE (DELTASONE) 20 MG tablet Take 2 tablets (40 mg total) by mouth daily for 5 days. 10 tablet Eusebio Friendly B, PA-C   albuterol (VENTOLIN HFA) 108 (90 Base) MCG/ACT inhaler Inhale 1-2 puffs into the lungs every 6 (six) hours as needed for wheezing or shortness of breath. 1 g Shirlee Latch, PA-C     PDMP not reviewed this encounter.   Shirlee Latch, PA-C 10/08/20 1146

## 2020-10-08 NOTE — ED Triage Notes (Signed)
Gave patient phone numbers for [facility], [facility] and [facility] to try to get an appointment.

## 2020-10-08 NOTE — Discharge Instructions (Signed)
Your chest x-ray did not show any acute changes.  You definitely have COPD as evidenced by your x-ray.  There is no pneumonia.  You are having difficulty breathing I suggest that you use the albuterol inhaler if sent to the pharmacy.  I also sent prednisone to the pharmacy.  Increase your rest and fluids and try to quit smoking.  You need to get set up with a primary care provider.  You have been given information on how to go about doing this.  You do have other issues that you need addressed so I advised that you contact your primary care provider.  If you have any acute worsening of your breathing you need to go to the emergency department.

## 2020-10-08 NOTE — ED Triage Notes (Signed)
Patient in today after falling on 10/07/20. Patient denies any specific injury. Patient is c/o increased SOB today. Patient states he has COPD, but is not on any medications. Patient has a list of problems he wants addressed. Patient does not have a PCP. He states it has been 6 years since he has seen a doctor.

## 2021-05-11 DEATH — deceased

## 2022-04-23 IMAGING — CR DG CHEST 2V
3 series · 4 of 4 positions shown · non-contrast
Comparison: 09/11/2015

CLINICAL DATA: Shortness of breath

EXAM:
CHEST - 2 VIEW

[Series 1: chest pa · 0.14mm/px · 2 of 2 slices shown (1 of 2)]
[im 1/2]
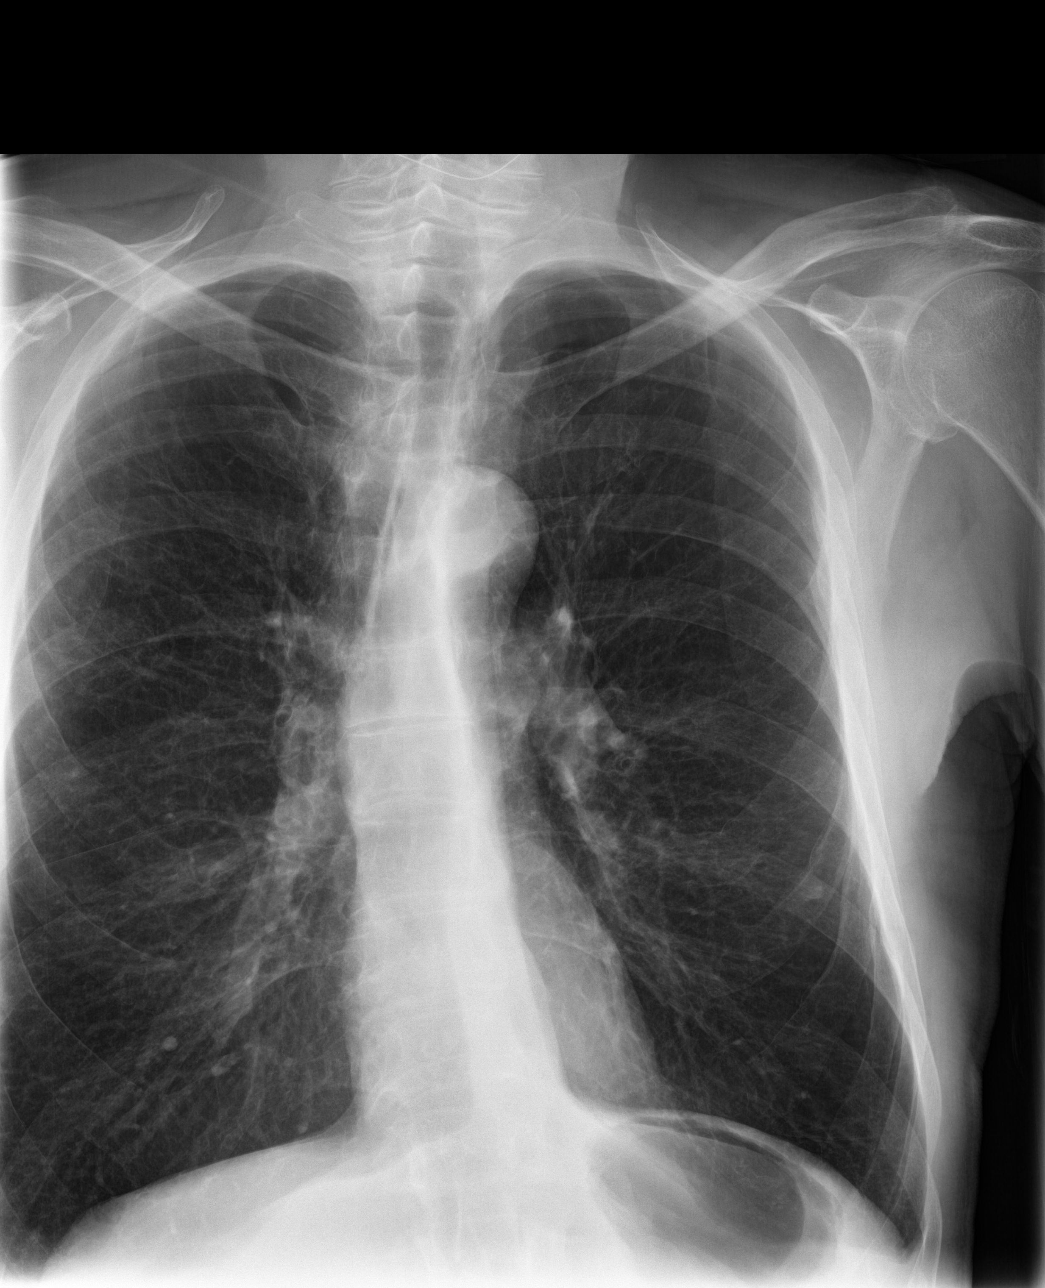
[im 2/2]
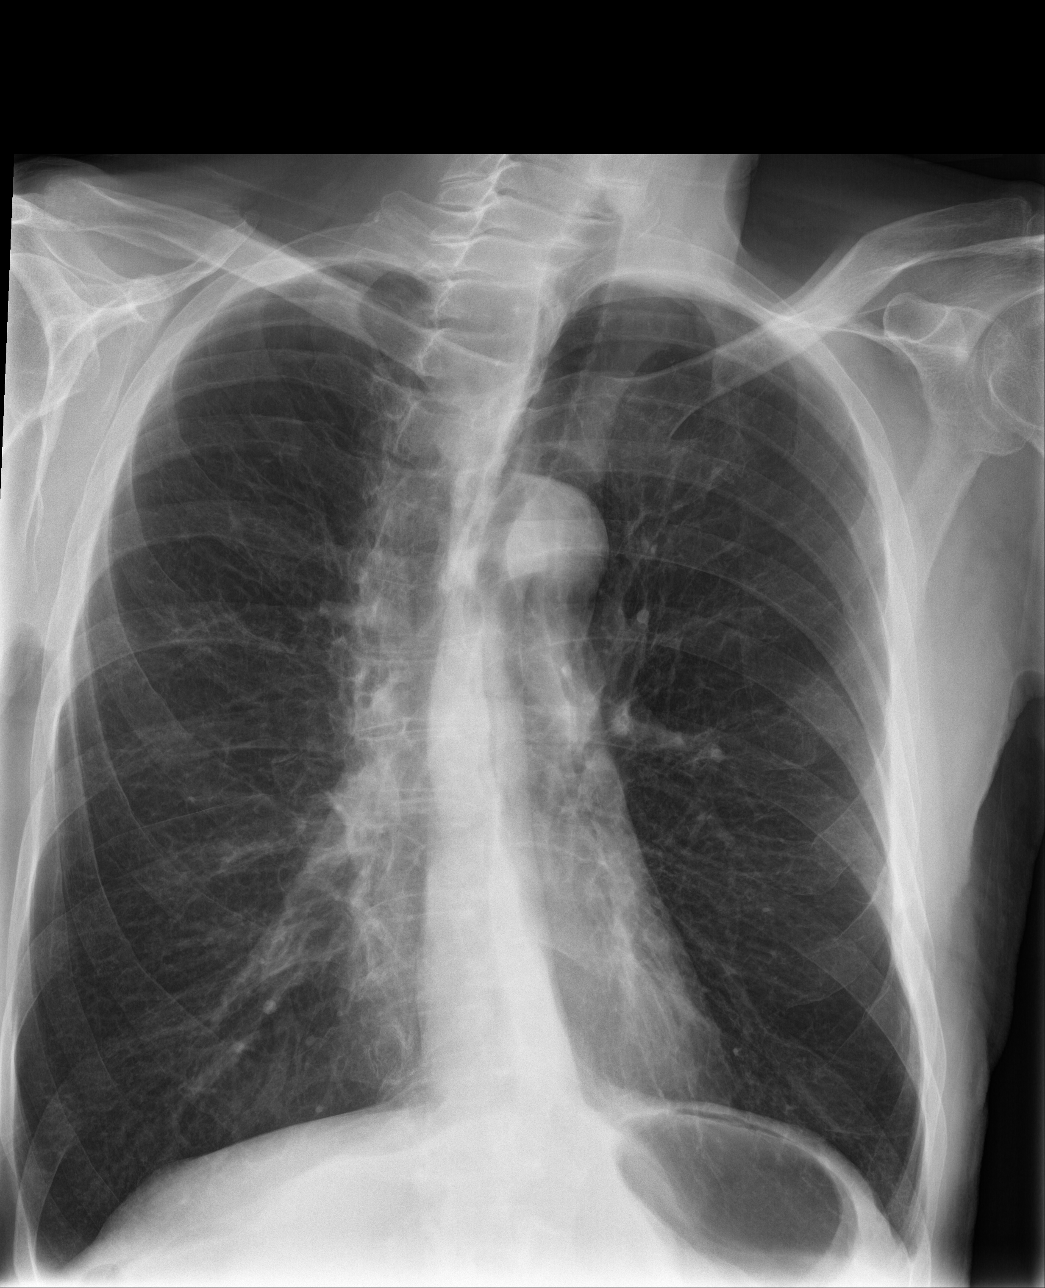

[chest lat]
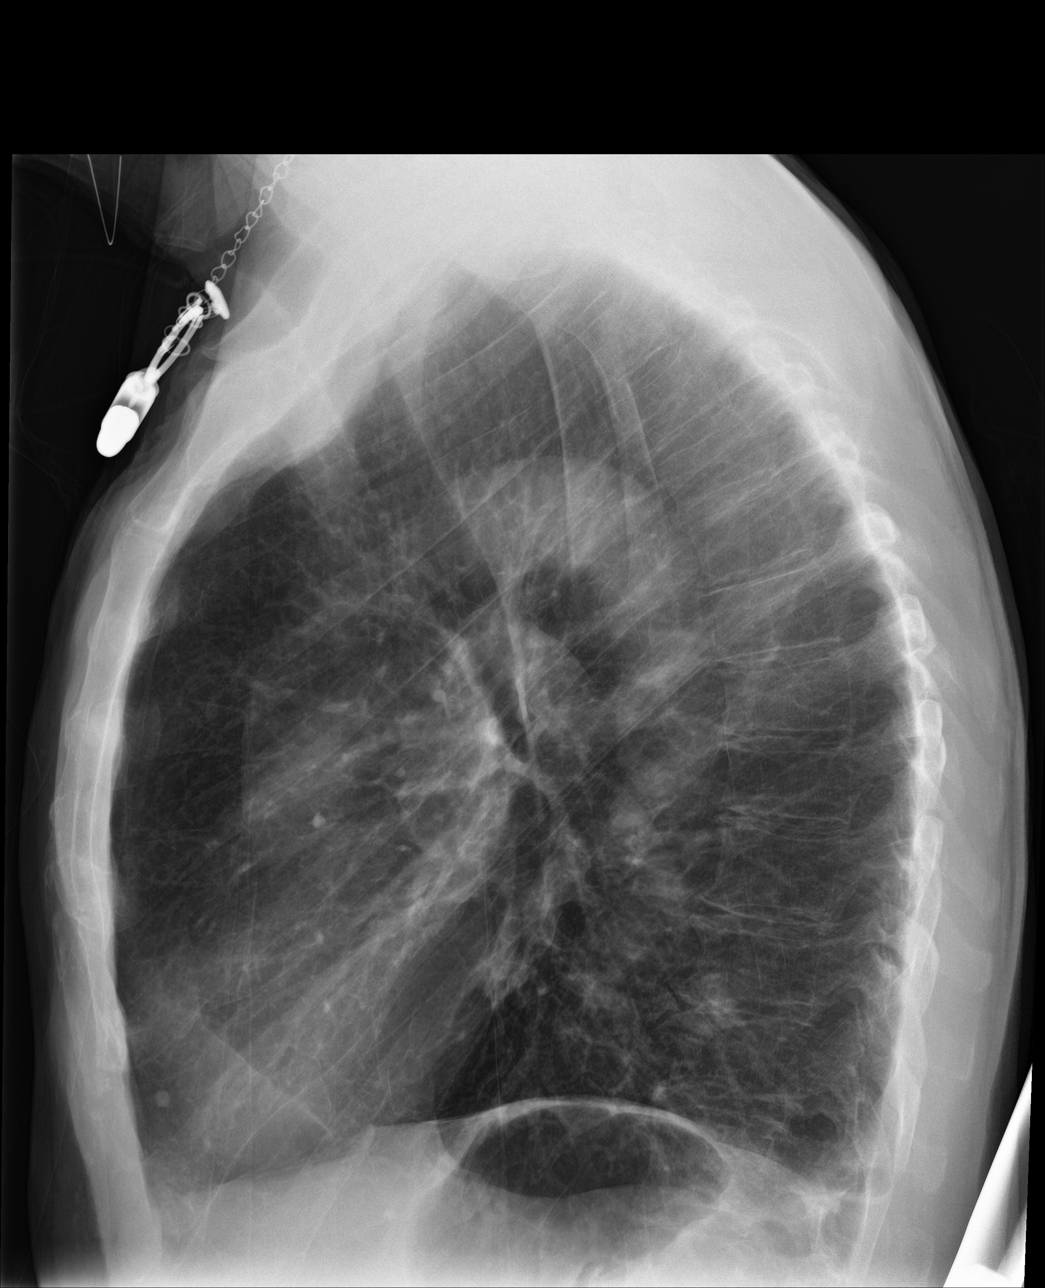

[chest pa (2 of 2)]
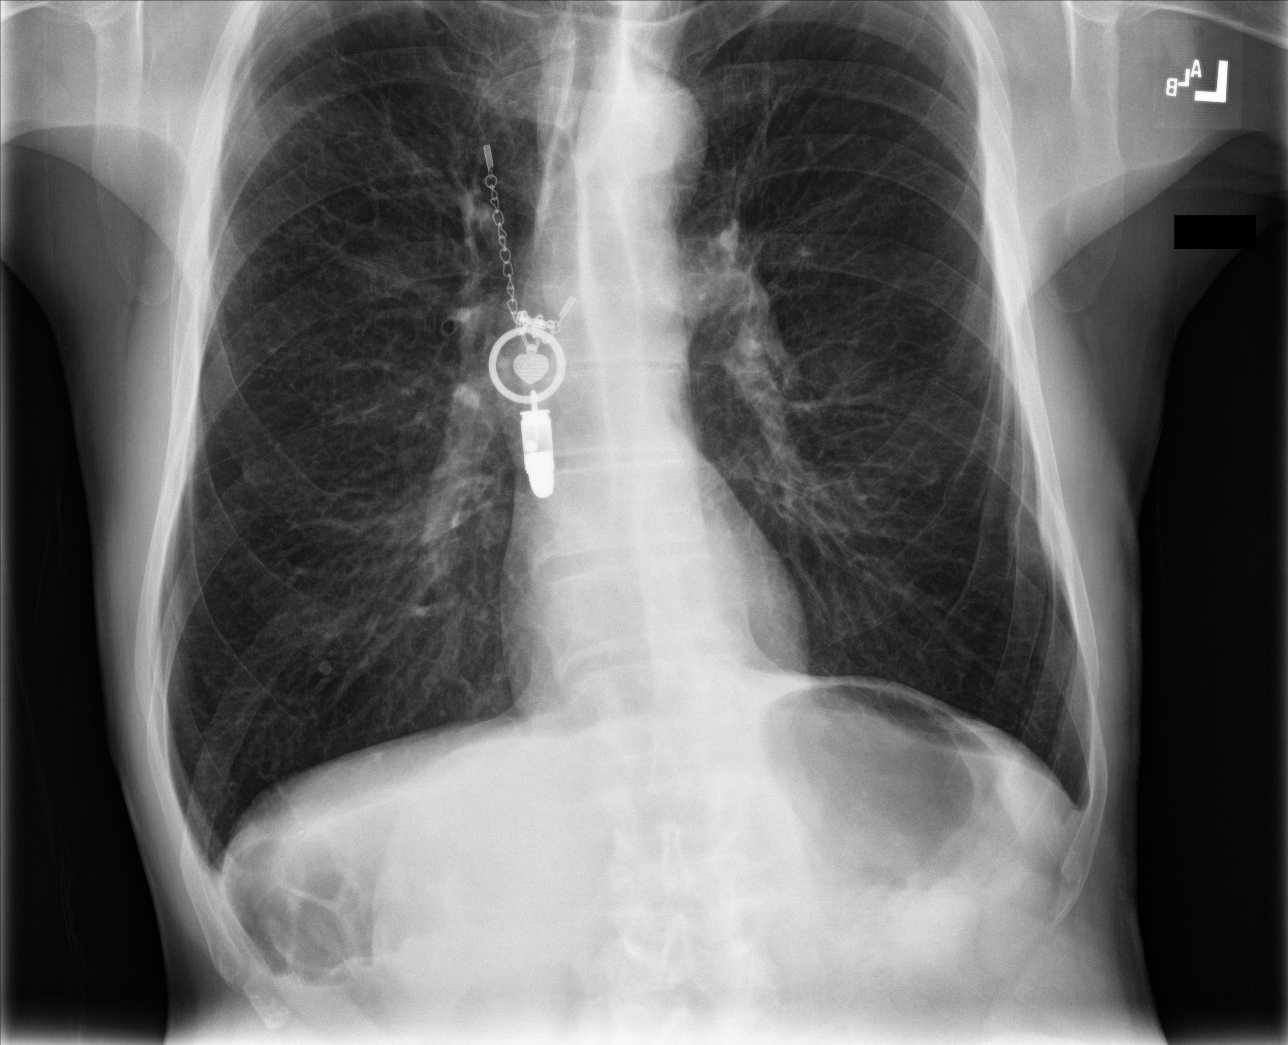

[4 of 4 positions shown; findings below may reference images not displayed]

FINDINGS: Cardiac shadow is within normal limits. The lungs are hyperinflated
consistent with COPD. No focal infiltrate or sizable effusion is
noted. Calcified granuloma is again seen in the right lung base and
stable. No acute bony abnormality is noted.
IMPRESSION: COPD without acute abnormality.

## 2023-01-12 ENCOUNTER — Other Ambulatory Visit: Payer: Self-pay
